# Patient Record
Sex: Male | Born: 1955 | Race: White | Hispanic: No | Smoking: Former smoker
Health system: Southern US, Community
[De-identification: ages and names within clinical notes are randomized; demographics above are authoritative.]

## PROBLEM LIST (undated history)

## (undated) DIAGNOSIS — I10 Essential (primary) hypertension: Secondary | ICD-10-CM

## (undated) DIAGNOSIS — I4892 Unspecified atrial flutter: Secondary | ICD-10-CM

## (undated) DIAGNOSIS — T7840XA Allergy, unspecified, initial encounter: Secondary | ICD-10-CM

## (undated) DIAGNOSIS — I2510 Atherosclerotic heart disease of native coronary artery without angina pectoris: Secondary | ICD-10-CM

## (undated) DIAGNOSIS — I5042 Chronic combined systolic (congestive) and diastolic (congestive) heart failure: Secondary | ICD-10-CM

## (undated) DIAGNOSIS — I255 Ischemic cardiomyopathy: Secondary | ICD-10-CM

## (undated) DIAGNOSIS — N281 Cyst of kidney, acquired: Secondary | ICD-10-CM

## (undated) DIAGNOSIS — F1910 Other psychoactive substance abuse, uncomplicated: Secondary | ICD-10-CM

## (undated) HISTORY — DX: Ischemic cardiomyopathy: I25.5

## (undated) HISTORY — DX: Cyst of kidney, acquired: N28.1

## (undated) HISTORY — DX: Other psychoactive substance abuse, uncomplicated: F19.10

## (undated) HISTORY — DX: Allergy, unspecified, initial encounter: T78.40XA

## (undated) HISTORY — PX: KNEE SURGERY: SHX244

## (undated) HISTORY — DX: Chronic combined systolic (congestive) and diastolic (congestive) heart failure: I50.42

## (undated) HISTORY — DX: Unspecified atrial flutter: I48.92

## (undated) HISTORY — PX: TONSILLECTOMY: SUR1361

## (undated) HISTORY — DX: Atherosclerotic heart disease of native coronary artery without angina pectoris: I25.10

---

## 2013-03-05 ENCOUNTER — Encounter (HOSPITAL_BASED_OUTPATIENT_CLINIC_OR_DEPARTMENT_OTHER): Payer: Self-pay | Admitting: *Deleted

## 2013-03-05 ENCOUNTER — Emergency Department (HOSPITAL_BASED_OUTPATIENT_CLINIC_OR_DEPARTMENT_OTHER)
Admission: EM | Admit: 2013-03-05 | Discharge: 2013-03-05 | Disposition: A | Discharge status: Home / Self Care | Payer: Self-pay | Attending: Emergency Medicine | Admitting: Emergency Medicine

## 2013-03-05 DIAGNOSIS — F17200 Nicotine dependence, unspecified, uncomplicated: Secondary | ICD-10-CM | POA: Insufficient documentation

## 2013-03-05 DIAGNOSIS — Z79899 Other long term (current) drug therapy: Secondary | ICD-10-CM | POA: Insufficient documentation

## 2013-03-05 DIAGNOSIS — I10 Essential (primary) hypertension: Secondary | ICD-10-CM | POA: Insufficient documentation

## 2013-03-05 HISTORY — DX: Essential (primary) hypertension: I10

## 2013-03-05 LAB — COMPREHENSIVE METABOLIC PANEL
ALT: 11 U/L (ref 0–53)
Albumin: 4.2 g/dL (ref 3.5–5.2)
Alkaline Phosphatase: 105 U/L (ref 39–117)
BUN: 14 mg/dL (ref 6–23)
Calcium: 9.7 mg/dL (ref 8.4–10.5)
GFR calc Af Amer: >90 mL/min (ref >90)
Glucose, Bld: 91 mg/dL (ref 70–99)
Potassium: 4.5 mEq/L (ref 3.5–5.1)
Sodium: 138 mEq/L (ref 135–145)
Total Protein: 8.3 g/dL (ref 6.0–8.3)

## 2013-03-05 LAB — CBC WITH DIFFERENTIAL/PLATELET
Basophils Relative: 1 % (ref 0–1)
Eosinophils Absolute: 0.2 10*3/uL (ref 0.0–0.7)
Eosinophils Relative: 2 % (ref 0–5)
Lymphs Abs: 2.1 10*3/uL (ref 0.7–4.0)
MCH: 29.6 pg (ref 26.0–34.0)
MCHC: 34.8 g/dL (ref 30.0–36.0)
MCV: 85.1 fL (ref 78.0–100.0)
Neutrophils Relative %: 56 % (ref 43–77)
Platelets: 189 10*3/uL (ref 150–400)
RBC: 5.84 MIL/uL — ABNORMAL HIGH (ref 4.22–5.81)

## 2013-03-05 LAB — TROPONIN I: Troponin I: <0.3 ng/mL (ref <0.30)

## 2013-03-05 MED ORDER — METOPROLOL TARTRATE 50 MG PO TABS
50.0000 mg | ORAL_TABLET | Freq: Once | ORAL | Status: AC
  Administered 2013-03-05: 50 mg via ORAL
  Filled 2013-03-05: qty 1

## 2013-03-05 MED ORDER — METOPROLOL TARTRATE 50 MG PO TABS: 50.0000 mg | ORAL_TABLET | Freq: Two times a day (BID) | ORAL | Status: DC

## 2013-03-05 NOTE — ED Notes (Signed)
Patient states he has chronic elevated blood pressure.  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  States he was at [facility] and yesterday he was admitted to [facility].  States this morning he was light headed and dizzy.  Asked to have his bp checked and it was 176 /120.  Repeated in one hour and his bp 172/110.

## 2013-03-05 NOTE — ED Provider Notes (Signed)
History     CSN: [id number]  Arrival date & time 03/05/13  1030   First MD Initiated Contact with Patient 03/05/13 1101      Chief Complaint  Patient presents with  . Hypertension    (Consider location/radiation/quality/duration/timing/severity/associated sxs/prior treatment) HPI Comments: Patient with history of htn.  Has been off of medications for the past 10 years.  Recently was admitted to [facility] for treatment of crack addiction then sent to [facility] yesterday.  His blood pressure was elevated there to 170/110 and was sent here for evaluation.  He tells me he feels a "little light-headed" but is otherwise without complaint.  No chest pain or shortness of breath.  Patient is a 57 y.o. male presenting with hypertension. The history is provided by the patient.  Hypertension This is a chronic problem. The problem occurs constantly. The problem has not changed since onset.Pertinent negatives include no chest pain and no shortness of breath. Nothing aggravates the symptoms. Nothing relieves the symptoms. He has tried nothing for the symptoms.    Past Medical History  Diagnosis Date  . Hypertension     Past Surgical History  Procedure Laterality Date  . Knee surgery      No family history on file.  History  Substance Use Topics  . Smoking status: Current Every Day Smoker -- 0.50 packs/day    Types: Cigarettes  . Smokeless tobacco: Never Used  . Alcohol Use: No      Review of Systems  Respiratory: Negative for shortness of breath.   Cardiovascular: Negative for chest pain.  All other systems reviewed and are negative.    Allergies  Review of patient's allergies indicates no known allergies.  Home Medications   Current Outpatient Rx  Name  Route  Sig  Dispense  Refill  . FLUoxetine (PROZAC) 20 MG capsule   Oral   Take 20 mg by mouth daily.         Marland Kitchen lisinopril (PRINIVIL,ZESTRIL) 10 MG tablet   Oral   Take 10 mg by mouth daily.           BP 160/111   Pulse 68  Temp(Src) 97.5 F (36.4 C) (Oral)  Resp 20  SpO2 100%  Physical Exam  Nursing note and vitals reviewed. Constitutional: He appears well-developed and well-nourished. No distress.  HENT:  Head: Normocephalic and atraumatic.  Mouth/Throat: Oropharynx is clear and moist.  Eyes: EOM are normal. Pupils are equal, round, and reactive to light.  Neck: Normal range of motion. Neck supple.  Cardiovascular: Normal rate and regular rhythm.   No murmur heard. Pulmonary/Chest: Effort normal and breath sounds normal. No respiratory distress. He has no wheezes.  Abdominal: Soft. Bowel sounds are normal. He exhibits no distension. There is no tenderness.  Musculoskeletal: Normal range of motion. He exhibits no edema.  Neurological: He is alert. No cranial nerve deficit. He exhibits normal muscle tone. Coordination normal.  Skin: Skin is warm and dry. He is not diaphoretic.    ED Course  Procedures (including critical care time)  Labs Reviewed  CBC WITH DIFFERENTIAL  COMPREHENSIVE METABOLIC PANEL   No results found.   No diagnosis found.   Date: 03/05/2013  Rate: 57  Rhythm: sinus bradycardia  QRS Axis: normal  Intervals: normal  ST/T Wave abnormalities: normal  Conduction Disutrbances:none  Narrative Interpretation:   Old EKG Reviewed: none available    MDM  The bp is improving with meds.  I suspect he has been hypertensive for quite some and  that this is not new.  Will start him on metoprolol until he can follow up with his pcp.        Geoffery Lyons, MD 03/05/13 1242

## 2013-04-20 ENCOUNTER — Encounter (HOSPITAL_BASED_OUTPATIENT_CLINIC_OR_DEPARTMENT_OTHER): Payer: Self-pay | Admitting: Family Medicine

## 2013-04-20 ENCOUNTER — Other Ambulatory Visit: Payer: Self-pay

## 2013-04-20 ENCOUNTER — Emergency Department (HOSPITAL_BASED_OUTPATIENT_CLINIC_OR_DEPARTMENT_OTHER): Payer: Self-pay

## 2013-04-20 ENCOUNTER — Emergency Department (HOSPITAL_BASED_OUTPATIENT_CLINIC_OR_DEPARTMENT_OTHER)
Admission: EM | Admit: 2013-04-20 | Discharge: 2013-04-20 | Disposition: A | Discharge status: Home / Self Care | Payer: Self-pay | Attending: Emergency Medicine | Admitting: Emergency Medicine

## 2013-04-20 DIAGNOSIS — I10 Essential (primary) hypertension: Secondary | ICD-10-CM | POA: Insufficient documentation

## 2013-04-20 DIAGNOSIS — F17200 Nicotine dependence, unspecified, uncomplicated: Secondary | ICD-10-CM | POA: Insufficient documentation

## 2013-04-20 DIAGNOSIS — Z79899 Other long term (current) drug therapy: Secondary | ICD-10-CM | POA: Insufficient documentation

## 2013-04-20 DIAGNOSIS — J3489 Other specified disorders of nose and nasal sinuses: Secondary | ICD-10-CM | POA: Insufficient documentation

## 2013-04-20 LAB — COMPREHENSIVE METABOLIC PANEL
Albumin: 3.9 g/dL (ref 3.5–5.2)
Alkaline Phosphatase: 107 U/L (ref 39–117)
BUN: 21 mg/dL (ref 6–23)
CO2: 26 mEq/L (ref 19–32)
Chloride: 104 mEq/L (ref 96–112)
Creatinine, Ser: 0.8 mg/dL (ref 0.50–1.35)
GFR calc Af Amer: >90 mL/min (ref >90)
GFR calc non Af Amer: >90 mL/min (ref >90)
Glucose, Bld: 102 mg/dL — ABNORMAL HIGH (ref 70–99)
Potassium: 4.6 mEq/L (ref 3.5–5.1)
Total Bilirubin: 0.3 mg/dL (ref 0.3–1.2)

## 2013-04-20 LAB — CBC WITH DIFFERENTIAL/PLATELET
HCT: 47.2 % (ref 39.0–52.0)
Hemoglobin: 16.2 g/dL (ref 13.0–17.0)
Lymphocytes Relative: 39 % (ref 12–46)
Lymphs Abs: 2.8 10*3/uL (ref 0.7–4.0)
Monocytes Absolute: 0.8 10*3/uL (ref 0.1–1.0)
Monocytes Relative: 12 % (ref 3–12)
Neutro Abs: 3.3 10*3/uL (ref 1.7–7.7)
Neutrophils Relative %: 46 % (ref 43–77)
RBC: 5.38 MIL/uL (ref 4.22–5.81)

## 2013-04-20 LAB — URINALYSIS, ROUTINE W REFLEX MICROSCOPIC
Glucose, UA: NEGATIVE mg/dL
Hgb urine dipstick: NEGATIVE
Leukocytes, UA: NEGATIVE
Specific Gravity, Urine: 1.036 — ABNORMAL HIGH (ref 1.005–1.030)
Urobilinogen, UA: 1 mg/dL (ref 0.0–1.0)

## 2013-04-20 NOTE — ED Provider Notes (Signed)
History    This chart was scribed for Alex Octave, MD by Leone Payor, ED Scribe. This patient was seen in room MH10/MH10 and the patient's care was started 4:12 PM.   CSN: [id number]  Arrival date & time 04/20/13  1300   First MD Initiated Contact with Patient 04/20/13 1605      Chief Complaint  Patient presents with  . Headache  . Nasal Congestion     The history is provided by the patient. No language interpreter was used.    Alex Winters is a 57 y.o. male who presents to the Emergency Department complaining of HTN starting today. Pt is at [facility] rehab for cocaine addiction. Pt takes lisinopril and lopressor starting about 1 months ago. Pt states he woke up with a gradual onset HA upon waking up that he rated as 8/10. Pt states it worsened but then he went back to sleep. He took ibuprofen later today with some relief. He denies having the HA currently. He does not get HAs frequently and denies blurry vision. His HA was on both sides of head, back and over sinuses. He denies chest pain, SOB, abdominal pain,    Past Medical History  Diagnosis Date  . Hypertension     Past Surgical History  Procedure Laterality Date  . Knee surgery      No family history on file.  History  Substance Use Topics  . Smoking status: Current Every Day Smoker -- 0.50 packs/day    Types: Cigarettes  . Smokeless tobacco: Never Used  . Alcohol Use: No      Review of Systems A complete 10 system review of systems was obtained and all systems are negative except as noted in the HPI and PMH.   Allergies  Review of patient's allergies indicates no known allergies.  Home Medications   Current Outpatient Rx  Name  Route  Sig  Dispense  Refill  . FLUoxetine (PROZAC) 20 MG capsule   Oral   Take 20 mg by mouth daily.         Marland Kitchen lisinopril (PRINIVIL,ZESTRIL) 10 MG tablet   Oral   Take 10 mg by mouth daily.         . metoprolol (LOPRESSOR) 50 MG tablet   Oral   Take 1 tablet (50  mg total) by mouth 2 (two) times daily.   60 tablet   1     BP 140/81  Pulse 54  Temp(Src) 98.7 F (37.1 C) (Oral)  Resp 16  Ht 6\' 2"  (1.88 m)  Wt 200 lb (90.719 kg)  BMI 25.67 kg/m2  SpO2 95%  Physical Exam  Nursing note and vitals reviewed. Constitutional: He is oriented to person, place, and time. He appears well-developed and well-nourished. No distress.  HENT:  Head: Normocephalic and atraumatic.  No temporal artery tenderness.  Eyes: EOM are normal.  Neck: Neck supple. No tracheal deviation present.  Cardiovascular: Normal rate, regular rhythm and normal heart sounds.   Pulmonary/Chest: Effort normal and breath sounds normal. No respiratory distress. He has no wheezes. He has no rales. He exhibits no tenderness.  Abdominal: Soft. He exhibits no distension. There is no tenderness. There is no rebound.  Musculoskeletal: Normal range of motion.  Neurological: He is alert and oriented to person, place, and time.  5/5 strength throughout, cranial nerves intact. Normal finger to nose.  Skin: Skin is warm and dry.  Psychiatric: He has a normal mood and affect. His behavior is normal.  ED Course  Procedures (including critical care time)  DIAGNOSTIC STUDIES: Oxygen Saturation is 95% on room air, adequate by my interpretation.    COORDINATION OF CARE: 4:24 PM-Discussed treatment plan with pt at bedside and pt agreed to plan.    Labs Reviewed  COMPREHENSIVE METABOLIC PANEL - Abnormal; Notable for the following:    Glucose, Bld 102 (*)    All other components within normal limits  URINALYSIS, ROUTINE W REFLEX MICROSCOPIC - Abnormal; Notable for the following:    Specific Gravity, Urine 1.036 (*)    Ketones, ur 15 (*)    All other components within normal limits  CBC WITH DIFFERENTIAL  TROPONIN I   Ct Head Wo Contrast  04/20/2013  *RADIOLOGY REPORT*  Clinical Data: Patient c/o HTN, dizziness, states that he has had an occipital region headache as well, headache  eases off when he presses on his temples, denies any known hx of stroke or injury, no other complaints  CT HEAD WITHOUT CONTRAST  Technique:  Contiguous axial images were obtained from the base of the skull through the vertex without contrast.  Comparison: None.  Findings: The brain stem, cerebellum, cerebral peduncles, thalami, basal ganglia, basilar cisterns, and ventricular system appear unremarkable.  No intracranial hemorrhage, mass lesion, or acute infarction is identified.  The visualized paranasal sinuses appear clear.  IMPRESSION:  1.  No significant abnormality identified.   Original Report Authenticated By: Gaylyn Rong, M.D.      No diagnosis found.       MDM  Patient from day [facility] with elevated blood pressure and gradual onset headache this morning. Denies thunderclap onset. Denies chest pain or shortness of breath. States compliance with lisinopril and metoprolol.  Labs unremarkable. No evidence of endorgan damage. Blood pressure well controlled in ED. Bradycardic on EKG without symptoms. No neuro deficits. Due to bradycardia will decrease Lopressor dose to 25 mg BID. Followup with PCP. Resource guide given.   Date: 04/20/2013  Rate: 45  Rhythm: sinus bradycardia  QRS Axis: normal  Intervals: normal  ST/T Wave abnormalities: normal  Conduction Disutrbances:none  Narrative Interpretation:   Old EKG Reviewed: unchanged      I personally performed the services described in this documentation, which was scribed in my presence. The recorded information has been reviewed and is accurate.      Alex Octave, MD 04/20/13 [phone]

## 2013-04-20 NOTE — ED Notes (Signed)
Pt c/o hypertension. He is at [facility] rehab and does not have a PCP yet. Pt takes bp med. Also c/o headache.

## 2013-06-30 DIAGNOSIS — I10 Essential (primary) hypertension: Secondary | ICD-10-CM | POA: Insufficient documentation

## 2013-07-10 IMAGING — CT CT HEAD W/O CM
1 series · 16 of 30 positions shown, 20 images · non-contrast
Comparison: None.

CLINICAL DATA: Patient c/o HTN, dizziness, states that he has had
an occipital region headache as well, headache eases off when he
presses on his temples, denies any known hx of stroke or injury, no
other complaints

CT HEAD WITHOUT CONTRAST
TECHNIQUE: Contiguous axial images were obtained from the base of
the skull through the vertex without contrast.

[Series 2: head 4.8 h37s · axial · 0.47mm/px · z∈[-126,+30]mm · 16 of 36 slices shown, 20 images]
[im 2/36  brain]
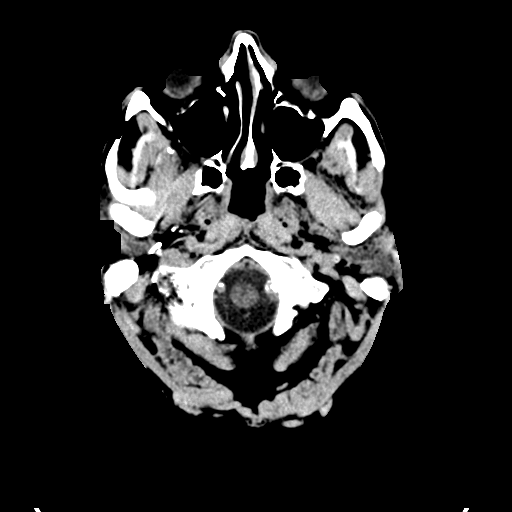
[im 2/36  bone]
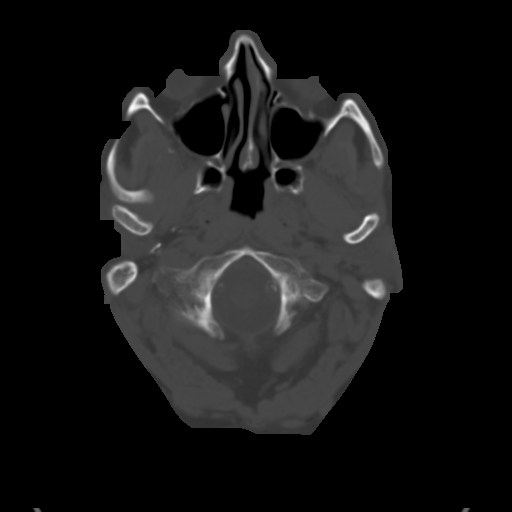
[im 4/36  brain]
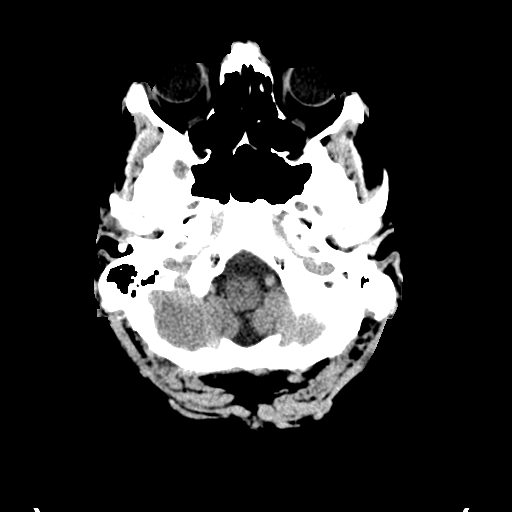
[im 7/36  brain]
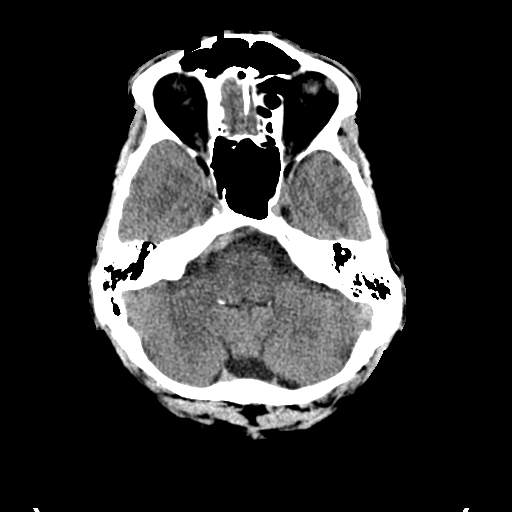
[im 9/36  brain]
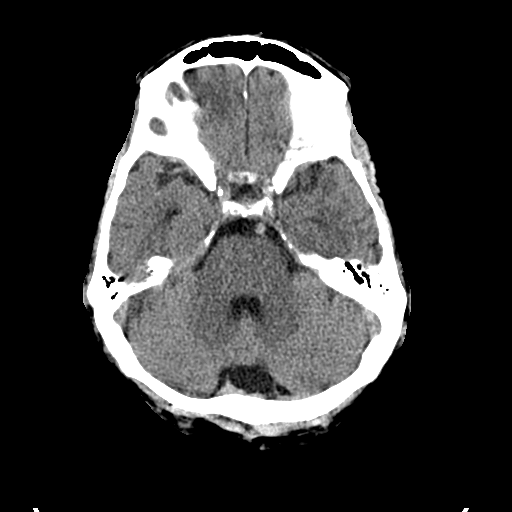
[im 10/36  brain]
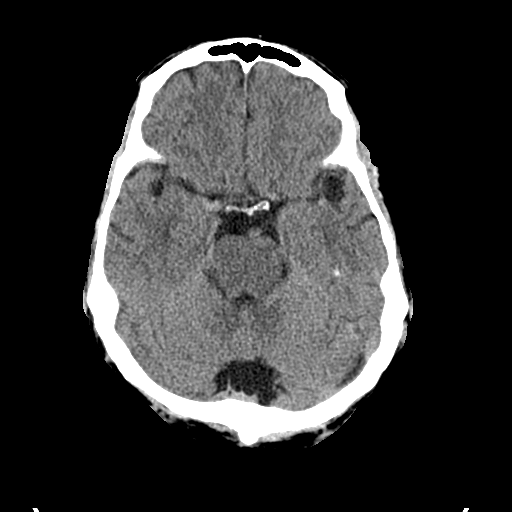
[im 10/36  bone]
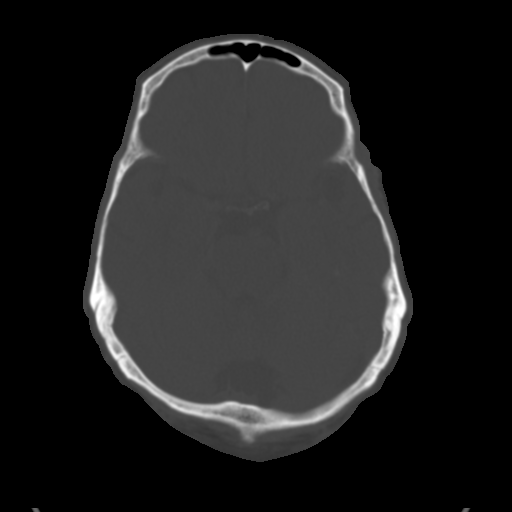
[im 13/36  brain]
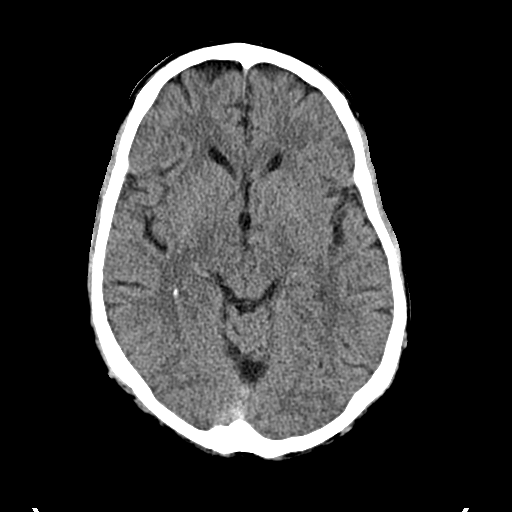
[im 15/36  brain]
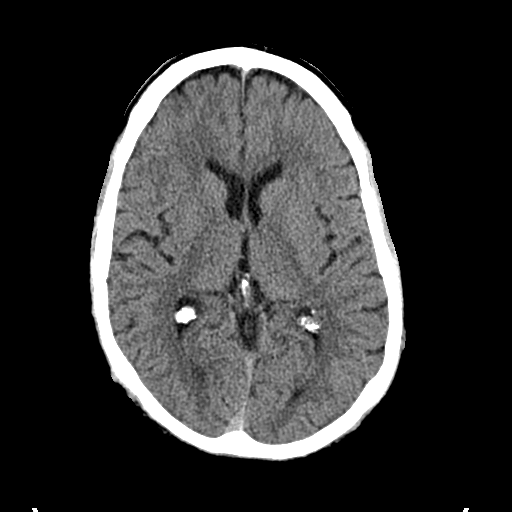
[im 17/36  brain]
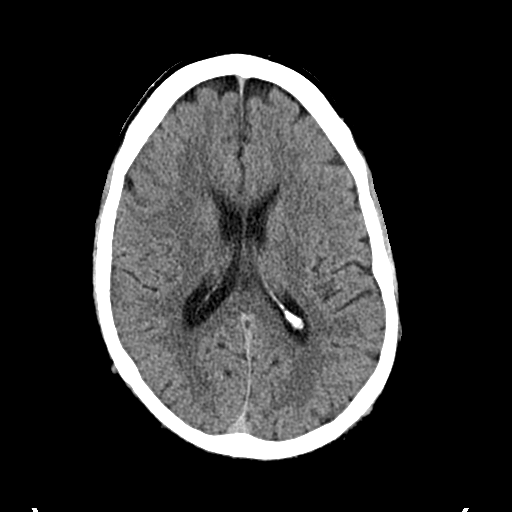
[im 19/36  brain]
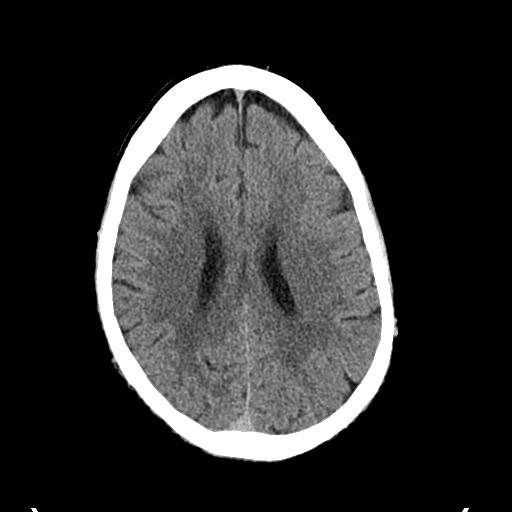
[im 19/36  bone]
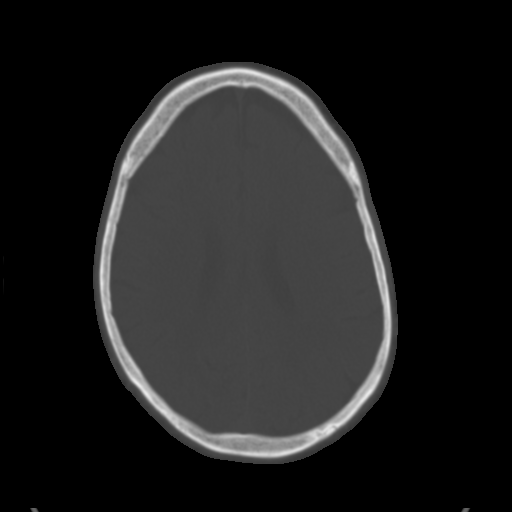
[im 21/36  brain]
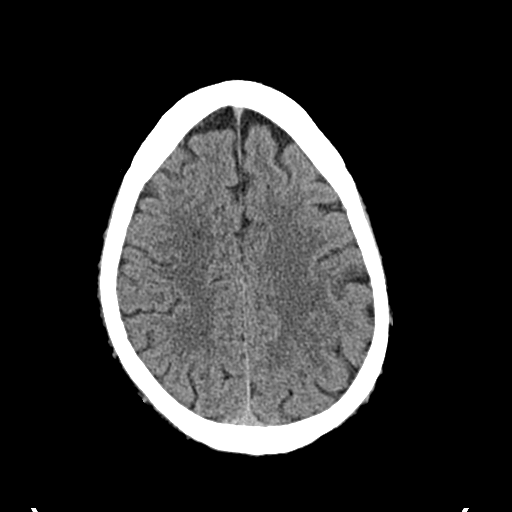
[im 23/36  brain]
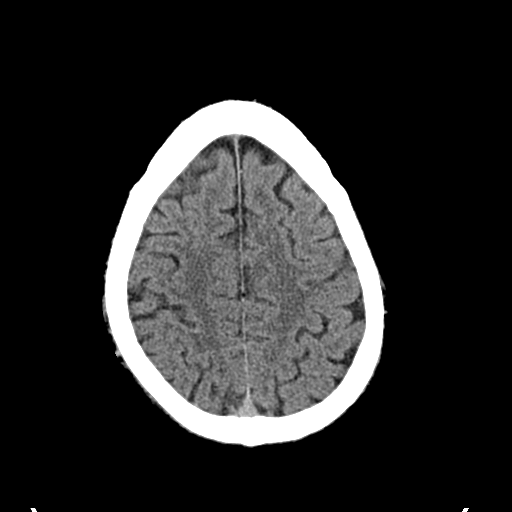
[im 26/36  brain]
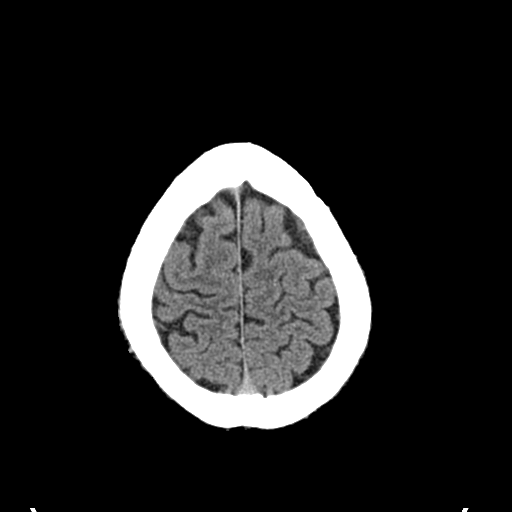
[im 27/36  brain]
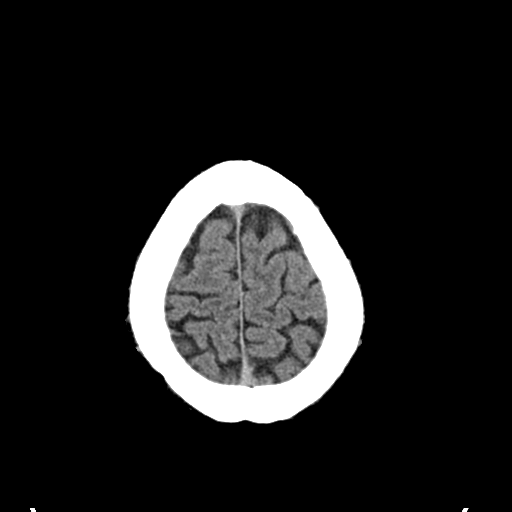
[im 27/36  bone]
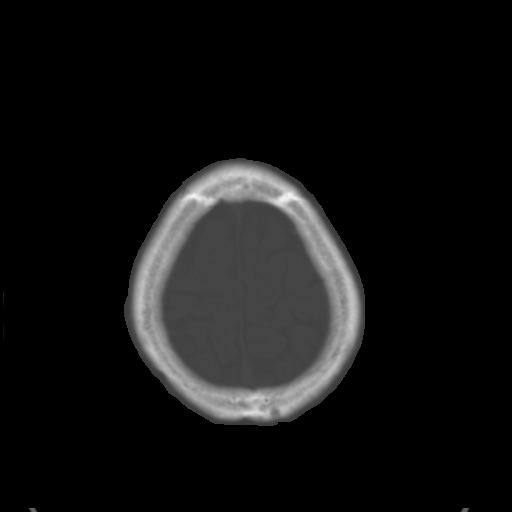
[im 29/36  brain]
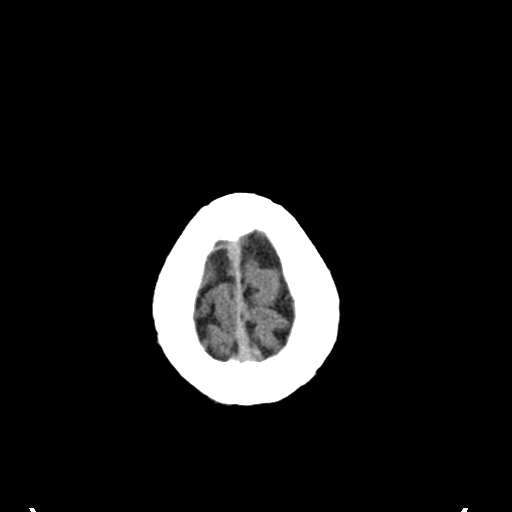
[im 32/36  brain]
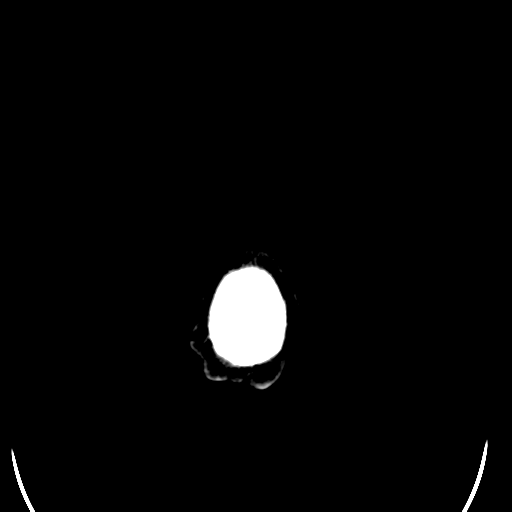
[im 34/36  brain]
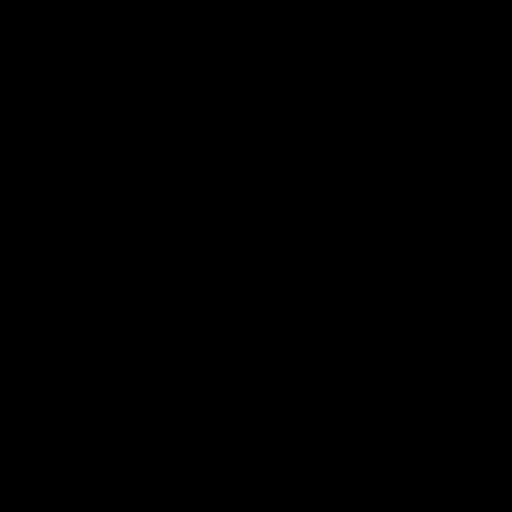

[16 of 30 positions shown; findings below may reference images not displayed]

FINDINGS: The brain stem, cerebellum, cerebral peduncles, thalami,
basal ganglia, basilar cisterns, and ventricular system appear
unremarkable.

No intracranial hemorrhage, mass lesion, or acute infarction is
identified.

The visualized paranasal sinuses appear clear.
IMPRESSION: 1.  No significant abnormality identified.

## 2013-11-25 LAB — POCT ERYTHROCYTE SEDIMENTATION RATE, NON-AUTOMATED: SED RATE: 3 mm

## 2013-12-09 ENCOUNTER — Emergency Department: Payer: Self-pay | Admitting: Internal Medicine

## 2013-12-18 ENCOUNTER — Ambulatory Visit: Payer: Self-pay | Admitting: Internal Medicine

## 2014-01-15 ENCOUNTER — Ambulatory Visit: Payer: Self-pay | Admitting: Internal Medicine

## 2014-02-02 LAB — CBC AND DIFFERENTIAL
HCT: 43 % (ref 41–53)
Hemoglobin: 14.7 g/dL (ref 13.5–17.5)
Neutrophils Absolute: 4 /uL
Platelets: 196 10*3/uL (ref 150–399)
WBC: 8.3 10^3/mL

## 2014-03-16 ENCOUNTER — Emergency Department: Payer: Self-pay | Admitting: Emergency Medicine

## 2014-03-16 LAB — COMPREHENSIVE METABOLIC PANEL
ALBUMIN: 3.7 g/dL (ref 3.4–5.0)
ALK PHOS: 106 U/L
Anion Gap: 4 — ABNORMAL LOW (ref 7–16)
BUN: 17 mg/dL (ref 7–18)
Bilirubin,Total: 0.4 mg/dL (ref 0.2–1.0)
CHLORIDE: 100 mmol/L (ref 98–107)
CREATININE: 1.03 mg/dL (ref 0.60–1.30)
Calcium, Total: 8.8 mg/dL (ref 8.5–10.1)
Co2: 26 mmol/L (ref 21–32)
EGFR (African American): >60
EGFR (Non-African Amer.): >60
GLUCOSE: 102 mg/dL — AB (ref 65–99)
Osmolality: 263 (ref 275–301)
Potassium: 4.2 mmol/L (ref 3.5–5.1)
SGOT(AST): 20 U/L (ref 15–37)
SGPT (ALT): 15 U/L (ref 12–78)
SODIUM: 130 mmol/L — AB (ref 136–145)
TOTAL PROTEIN: 7.7 g/dL (ref 6.4–8.2)

## 2014-03-16 LAB — CBC
HCT: 47.3 % (ref 40.0–52.0)
HGB: 16.1 g/dL (ref 13.0–18.0)
MCH: 30 pg (ref 26.0–34.0)
MCHC: 34 g/dL (ref 32.0–36.0)
MCV: 88 fL (ref 80–100)
PLATELETS: 127 10*3/uL — AB (ref 150–440)
RBC: 5.36 10*6/uL (ref 4.40–5.90)
RDW: 13.8 % (ref 11.5–14.5)
WBC: 5 10*3/uL (ref 3.8–10.6)

## 2014-03-16 LAB — LIPASE, BLOOD: Lipase: 216 U/L (ref 73–393)

## 2014-06-21 ENCOUNTER — Other Ambulatory Visit: Payer: Self-pay | Admitting: *Deleted

## 2014-12-21 LAB — HEPATIC FUNCTION PANEL
ALT: 6 U/L — AB (ref 10–40)
AST: 13 U/L — AB (ref 14–40)
Alkaline Phosphatase: 88 U/L (ref 25–125)
BILIRUBIN DIRECT: 0.08 mg/dL (ref 0.01–0.4)
Bilirubin, Total: 0.3 mg/dL

## 2014-12-21 LAB — LIPID PANEL
Cholesterol: 240 mg/dL — AB (ref 0–200)
HDL: 30 mg/dL — AB (ref 35–70)
LDL CALC: 144 mg/dL
Triglycerides: 331 mg/dL — AB (ref 40–160)

## 2014-12-21 LAB — BASIC METABOLIC PANEL
BUN: 14 mg/dL (ref 4–21)
Creatinine: 0.8 mg/dL (ref 0.6–1.3)
GLUCOSE: 91 mg/dL
POTASSIUM: 5.7 mmol/L — AB (ref 3.4–5.3)
SODIUM: 137 mmol/L (ref 137–147)

## 2014-12-21 LAB — TSH: TSH: 2.27 u[IU]/mL (ref 0.41–5.90)

## 2014-12-28 DIAGNOSIS — E785 Hyperlipidemia, unspecified: Secondary | ICD-10-CM | POA: Insufficient documentation

## 2016-09-10 DIAGNOSIS — E785 Hyperlipidemia, unspecified: Secondary | ICD-10-CM

## 2016-09-10 DIAGNOSIS — I10 Essential (primary) hypertension: Secondary | ICD-10-CM

## 2016-09-11 ENCOUNTER — Emergency Department
Admission: EM | Admit: 2016-09-11 | Discharge: 2016-09-11 | Disposition: A | Discharge status: Home / Self Care | Payer: Self-pay | Attending: Emergency Medicine | Admitting: Emergency Medicine

## 2016-09-11 ENCOUNTER — Encounter: Payer: Self-pay | Admitting: Emergency Medicine

## 2016-09-11 DIAGNOSIS — K029 Dental caries, unspecified: Secondary | ICD-10-CM | POA: Insufficient documentation

## 2016-09-11 DIAGNOSIS — K047 Periapical abscess without sinus: Secondary | ICD-10-CM | POA: Insufficient documentation

## 2016-09-11 DIAGNOSIS — F17210 Nicotine dependence, cigarettes, uncomplicated: Secondary | ICD-10-CM | POA: Insufficient documentation

## 2016-09-11 DIAGNOSIS — I10 Essential (primary) hypertension: Secondary | ICD-10-CM | POA: Insufficient documentation

## 2016-09-11 DIAGNOSIS — Z79899 Other long term (current) drug therapy: Secondary | ICD-10-CM | POA: Insufficient documentation

## 2016-09-11 MED ORDER — LIDOCAINE VISCOUS 2 % MT SOLN
15.0000 mL | Freq: Once | OROMUCOSAL | Status: AC
  Administered 2016-09-11: 15 mL via OROMUCOSAL
  Filled 2016-09-11: qty 15

## 2016-09-11 MED ORDER — OXYCODONE-ACETAMINOPHEN 5-325 MG PO TABS
1.0000 | ORAL_TABLET | Freq: Once | ORAL | Status: AC
  Administered 2016-09-11: 1 via ORAL
  Filled 2016-09-11: qty 1

## 2016-09-11 MED ORDER — AMOXICILLIN 500 MG PO CAPS
500.0000 mg | ORAL_CAPSULE | Freq: Once | ORAL | Status: AC
  Administered 2016-09-11: 500 mg via ORAL
  Filled 2016-09-11: qty 1

## 2016-09-11 MED ORDER — CLINDAMYCIN HCL 300 MG PO CAPS: 300.0000 mg | ORAL_CAPSULE | Freq: Three times a day (TID) | ORAL | 0 refills | Status: AC

## 2016-09-11 MED ORDER — AMLODIPINE BESYLATE 5 MG PO TABS: 5.0000 mg | ORAL_TABLET | Freq: Every day | ORAL | 3 refills | Status: DC

## 2016-09-11 MED ORDER — LISINOPRIL 10 MG PO TABS: 40.0000 mg | ORAL_TABLET | Freq: Every day | ORAL | 3 refills | Status: DC

## 2016-09-11 MED ORDER — OXYCODONE-ACETAMINOPHEN 5-325 MG PO TABS: 1.0000 | ORAL_TABLET | ORAL | 0 refills | Status: DC | PRN

## 2016-09-11 MED ORDER — AMOXICILLIN 875 MG PO TABS: 875.0000 mg | ORAL_TABLET | Freq: Two times a day (BID) | ORAL | 0 refills | Status: AC

## 2016-09-11 NOTE — ED Notes (Signed)
Discharge instructions reviewed with patient. Questions fielded by this RN. Patient verbalizes understanding of instructions. Patient discharged home in stable condition per Manson PasseyBrown MD . No acute distress noted at time of discharge. Pt given a hand out in regards to medication assistance.

## 2016-09-11 NOTE — ED Provider Notes (Signed)
[facility] Emergency Department Provider Note   First MD Initiated Contact with Patient 09/11/16 [phone]     (approximate)  I have reviewed the triage vital signs and the nursing notes.   HISTORY  Chief Complaint Dental Pain   HPI Alex Winters is a 60 y.o. male presents with left maxillary premolar pain diffuse dental cavities.. Patient denies any fever no difficulty swallowing. Patient admits to left jaw swelling.   Past Medical History:  Diagnosis Date  . Allergy    Nuts  . Hypertension     Patient Active Problem List   Diagnosis Date Noted  . Hyperlipidemia 12/28/2014  . Hypertension 06/30/2013    Past Surgical History:  Procedure Laterality Date  . KNEE SURGERY    . TONSILLECTOMY      Prior to Admission medications   Medication Sig Start Date End Date Taking? Authorizing Provider  FLUoxetine (PROZAC) 20 MG capsule Take 20 mg by mouth daily.    Historical Provider, MD  lisinopril (PRINIVIL,ZESTRIL) 10 MG tablet Take 10 mg by mouth daily.    Historical Provider, MD  metoprolol (LOPRESSOR) 50 MG tablet Take 1 tablet (50 mg total) by mouth 2 (two) times daily. 03/05/13   Geoffery Lyonsouglas Delo, MD    Allergies Peanut-containing drug products  Family History  Problem Relation Age of Onset  . Heart disease Mother   . Diabetes Mother   . Hypertension Father     Social History Social History  Substance Use Topics  . Smoking status: Current Every Day Smoker    Packs/day: 0.50    Types: Cigarettes  . Smokeless tobacco: Never Used  . Alcohol use No    Review of Systems Constitutional: No fever/chills Eyes: No visual changes. ENT: No sore throat.Positive for dental caries and left jaw swelling Cardiovascular: Denies chest pain. Respiratory: Denies shortness of breath. Gastrointestinal: No abdominal pain.  No nausea, no vomiting.  No diarrhea.  No constipation. Genitourinary: Negative for dysuria. Musculoskeletal: Negative for back  pain. Skin: Negative for rash. Neurological: Negative for headaches, focal weakness or numbness.  10-point ROS otherwise negative.  ____________________________________________   PHYSICAL EXAM:  VITAL SIGNS: ED Triage Vitals [09/11/16 0122]  Enc Vitals Group     BP (!) 192/107     Pulse Rate 74     Resp 18     Temp 97.7 F (36.5 C)     Temp Source Oral     SpO2 99 %     Weight 190 lb (86.2 kg)     Height 6\' 1"  (1.854 m)     Head Circumference      Peak Flow      Pain Score 10     Pain Loc      Pain Edu?      Excl. in GC?     Constitutional: Alert and oriented. Well appearing and in no acute distress. Eyes: Conjunctivae are normal. PERRL. EOMI. Head: Atraumatic. Mouth/Throat: Mucous membranes are moist.  Oropharynx non-erythematous.Diffuse dental caries Neck: No stridor.  No meningeal signs.   Cardiovascular: Normal rate, regular rhythm. Good peripheral circulation. Grossly normal heart sounds. Respiratory: Normal respiratory effort.  No retractions. Lungs CTAB. Gastrointestinal: Soft and nontender. No distention.  Musculoskeletal: No lower extremity tenderness nor edema. No gross deformities of extremities. Neurologic:  Normal speech and language. No gross focal neurologic deficits are appreciated.  Skin:  Skin is warm, dry and intact. No rash noted.     Procedures     INITIAL  IMPRESSION / ASSESSMENT AND PLAN / ED COURSE  Pertinent labs & imaging results that were available during my care of the patient were reviewed by me and considered in my medical decision making (see chart for details).     Clinical Course    ____________________________________________  FINAL CLINICAL IMPRESSION(S) / ED DIAGNOSES  Final diagnoses:  Dental caries  Dental abscess     MEDICATIONS GIVEN DURING THIS VISIT:  Medications  lidocaine (XYLOCAINE) 2 % viscous mouth solution 15 mL (15 mLs Mouth/Throat Given 09/11/16 0257)  oxyCODONE-acetaminophen (PERCOCET/ROXICET)  5-325 MG per tablet 1 tablet (1 tablet Oral Given 09/11/16 0257)  amoxicillin (AMOXIL) capsule 500 mg (500 mg Oral Given 09/11/16 0257)     NEW OUTPATIENT MEDICATIONS STARTED DURING THIS VISIT:  New Prescriptions   No medications on file    Modified Medications   No medications on file    Discontinued Medications   No medications on file     Note:  This document was prepared using Dragon voice recognition software and may include unintentional dictation errors.    Darci Current, MD 09/11/16 [phone]

## 2016-09-11 NOTE — ED Triage Notes (Addendum)
Pt ambulatory to triage with steady gait with c/o left sided toothache and swelling since Saturday. Pt reports loose tooth on upper left jaw. Pt reports hx of HTN but states has not been able to afford medicine so has not taken them for months.

## 2017-02-06 ENCOUNTER — Encounter: Payer: Self-pay | Admitting: *Deleted

## 2017-02-06 ENCOUNTER — Inpatient Hospital Stay (HOSPITAL_COMMUNITY)
Admit: 2017-02-06 | Discharge: 2017-02-06 | Disposition: A | Discharge status: Home / Self Care | Payer: Self-pay | Attending: Internal Medicine | Admitting: Internal Medicine

## 2017-02-06 ENCOUNTER — Inpatient Hospital Stay
Admission: EM | Admit: 2017-02-06 | Discharge: 2017-02-07 | DRG: 310 | Disposition: A | Discharge status: Home / Self Care | Payer: Self-pay | Attending: Internal Medicine | Admitting: Internal Medicine

## 2017-02-06 ENCOUNTER — Emergency Department: Payer: Self-pay

## 2017-02-06 DIAGNOSIS — I4892 Unspecified atrial flutter: Secondary | ICD-10-CM | POA: Diagnosis present

## 2017-02-06 DIAGNOSIS — Z91010 Allergy to peanuts: Secondary | ICD-10-CM

## 2017-02-06 DIAGNOSIS — Z833 Family history of diabetes mellitus: Secondary | ICD-10-CM

## 2017-02-06 DIAGNOSIS — Z8249 Family history of ischemic heart disease and other diseases of the circulatory system: Secondary | ICD-10-CM

## 2017-02-06 DIAGNOSIS — I4589 Other specified conduction disorders: Secondary | ICD-10-CM | POA: Diagnosis present

## 2017-02-06 DIAGNOSIS — E785 Hyperlipidemia, unspecified: Secondary | ICD-10-CM | POA: Diagnosis present

## 2017-02-06 DIAGNOSIS — I10 Essential (primary) hypertension: Secondary | ICD-10-CM | POA: Diagnosis present

## 2017-02-06 DIAGNOSIS — I483 Typical atrial flutter: Principal | ICD-10-CM | POA: Diagnosis present

## 2017-02-06 DIAGNOSIS — F17210 Nicotine dependence, cigarettes, uncomplicated: Secondary | ICD-10-CM | POA: Diagnosis present

## 2017-02-06 DIAGNOSIS — Z720 Tobacco use: Secondary | ICD-10-CM

## 2017-02-06 LAB — COMPREHENSIVE METABOLIC PANEL
ALK PHOS: 83 U/L (ref 38–126)
ALT: 11 U/L — ABNORMAL LOW (ref 17–63)
ANION GAP: 10 (ref 5–15)
AST: 22 U/L (ref 15–41)
Albumin: 3.8 g/dL (ref 3.5–5.0)
BILIRUBIN TOTAL: 0.4 mg/dL (ref 0.3–1.2)
BUN: 10 mg/dL (ref 6–20)
CALCIUM: 8.7 mg/dL — AB (ref 8.9–10.3)
CO2: 22 mmol/L (ref 22–32)
Chloride: 107 mmol/L (ref 101–111)
Creatinine, Ser: 1.06 mg/dL (ref 0.61–1.24)
Glucose, Bld: 99 mg/dL (ref 65–99)
Potassium: 4.2 mmol/L (ref 3.5–5.1)
Sodium: 139 mmol/L (ref 135–145)
TOTAL PROTEIN: 6.9 g/dL (ref 6.5–8.1)

## 2017-02-06 LAB — APTT: aPTT: 25 seconds (ref 24–36)

## 2017-02-06 LAB — CBC WITH DIFFERENTIAL/PLATELET
Basophils Absolute: 0.1 10*3/uL (ref 0–0.1)
Basophils Relative: 1 %
Eosinophils Absolute: 0.1 10*3/uL (ref 0–0.7)
Eosinophils Relative: 1 %
HEMATOCRIT: 51.6 % (ref 40.0–52.0)
HEMOGLOBIN: 17.3 g/dL (ref 13.0–18.0)
LYMPHS ABS: 2.1 10*3/uL (ref 1.0–3.6)
Lymphocytes Relative: 25 %
MCH: 29.7 pg (ref 26.0–34.0)
MCHC: 33.6 g/dL (ref 32.0–36.0)
MCV: 88.4 fL (ref 80.0–100.0)
MONO ABS: 0.6 10*3/uL (ref 0.2–1.0)
MONOS PCT: 8 %
NEUTROS PCT: 65 %
Neutro Abs: 5.5 10*3/uL (ref 1.4–6.5)
Platelets: 192 10*3/uL (ref 150–440)
RBC: 5.84 MIL/uL (ref 4.40–5.90)
RDW: 13.9 % (ref 11.5–14.5)
WBC: 8.5 10*3/uL (ref 3.8–10.6)

## 2017-02-06 LAB — PROTIME-INR
INR: 0.92
Prothrombin Time: 12.3 seconds (ref 11.4–15.2)

## 2017-02-06 LAB — TSH: TSH: 2.379 u[IU]/mL (ref 0.350–4.500)

## 2017-02-06 LAB — TROPONIN I: TROPONIN I: 0.03 ng/mL — AB (ref <0.03)

## 2017-02-06 LAB — MAGNESIUM: MAGNESIUM: 2 mg/dL (ref 1.7–2.4)

## 2017-02-06 MED ORDER — DILTIAZEM LOAD VIA INFUSION
15.0000 mg | Freq: Once | INTRAVENOUS | Status: AC
  Administered 2017-02-06: 15 mg via INTRAVENOUS
  Filled 2017-02-06: qty 15

## 2017-02-06 MED ORDER — METOPROLOL TARTRATE 25 MG PO TABS
25.0000 mg | ORAL_TABLET | Freq: Two times a day (BID) | ORAL | Status: DC
  Administered 2017-02-06 – 2017-02-07 (×3): 25 mg via ORAL
  Filled 2017-02-06 (×4): qty 1

## 2017-02-06 MED ORDER — SODIUM CHLORIDE 0.9 % IV BOLUS (SEPSIS)
1000.0000 mL | Freq: Once | INTRAVENOUS | Status: AC
  Administered 2017-02-06: 1000 mL via INTRAVENOUS

## 2017-02-06 MED ORDER — APIXABAN 5 MG PO TABS
5.0000 mg | ORAL_TABLET | Freq: Two times a day (BID) | ORAL | Status: DC
  Administered 2017-02-06 – 2017-02-07 (×3): 5 mg via ORAL
  Filled 2017-02-06 (×3): qty 1

## 2017-02-06 MED ORDER — ONDANSETRON HCL 4 MG/2ML IJ SOLN: 4.0000 mg | Freq: Four times a day (QID) | INTRAMUSCULAR | Status: DC | PRN

## 2017-02-06 MED ORDER — ACETAMINOPHEN 650 MG RE SUPP: 650.0000 mg | Freq: Four times a day (QID) | RECTAL | Status: DC | PRN

## 2017-02-06 MED ORDER — HEPARIN BOLUS VIA INFUSION
4000.0000 [IU] | Freq: Once | INTRAVENOUS | Status: DC
  Filled 2017-02-06: qty 4000

## 2017-02-06 MED ORDER — ONDANSETRON HCL 4 MG PO TABS: 4.0000 mg | ORAL_TABLET | Freq: Four times a day (QID) | ORAL | Status: DC | PRN

## 2017-02-06 MED ORDER — HEPARIN (PORCINE) IN NACL 100-0.45 UNIT/ML-% IJ SOLN
1200.0000 [IU]/h | INTRAMUSCULAR | Status: DC
  Filled 2017-02-06: qty 250

## 2017-02-06 MED ORDER — METOPROLOL TARTRATE 50 MG PO TABS: 50.0000 mg | ORAL_TABLET | Freq: Two times a day (BID) | ORAL | Status: DC

## 2017-02-06 MED ORDER — DILTIAZEM HCL 100 MG IV SOLR
5.0000 mg/h | INTRAVENOUS | Status: DC
  Administered 2017-02-06: 5 mg/h via INTRAVENOUS
  Administered 2017-02-06: 10 mg/h via INTRAVENOUS
  Administered 2017-02-06: 15 mg/h via INTRAVENOUS
  Filled 2017-02-06: qty 100

## 2017-02-06 MED ORDER — ACETAMINOPHEN 325 MG PO TABS: 650.0000 mg | ORAL_TABLET | Freq: Four times a day (QID) | ORAL | Status: DC | PRN

## 2017-02-06 NOTE — Consult Note (Signed)
Cardiology Consultation Note  Patient ID: Alex Winters, MRN: [id number], DOB/AGE: [birth year] 61 y.o. Admit date: 02/06/2017   Date of Consult: 02/06/2017 Primary Physician: No PCP Per Patient Primary Cardiologist: [facility] - consult by End Requesting Physician: Dr. Luberta MutterKonidena, MD  Chief Complaint: Palpitations/SOB Reason for Consult: New onset atrial flutter with RVR  HPI: 61 y.o. male with h/o HTN and ongoing tobacco abuse presented to [facility] on [facility]/7 with a one day history of palpitations and elevated blood pressure.   He does not have any previously known cardiac history. Patient was previously on metoprolol, lisinopril and amlodipine for BP control. He was seen recently in the ED for dental pain and noted to be hypertensive. He was given prescriptions for lisinopril and amlodipine and told to follow up with PCP. He did not. Patient has not taken his medications since then. On the morning of 2/6 he noted palpitations and increased SOB when he woke up. He thought it was related to his BP so he restarted his medications. He went to work on 2/6 all day with continued palpitations. Slept well all night into 2/7. When he got up this morning he still noted palpitations. So he took his BP and found it to be in the 140's over 1-teens. Heart rate in the 150's bpm. He decided to come to the ED.    Upon the [facility]'s [facility] they were found to be in new onset typical atrial flutter with 2:1 AV conduction with a heart rate in the 150's bpm. BP stable in the 130-140 systolic range. He was given IV diltiazem without improvement in the heart rate. Cardiology was called who advised starting a diltiazem gtt. This led the patient to convert to a junctional rhythm at 12:14 PM followed by a short run of atrial tachycardia followed by conversion to sinus rhythm with a heart rate in the 60's bpm at 12:15 PM and has remained in sinus rhythm since. Labs showed a troponin of 0.03, magnesium 2.0, TSH pending, K+ 4.2,  SCr 1.06, unremarkable CBC. ECG as below, CXR showed no acute process. He was started on Eliquis by IM. He is feeling much better now and wants to go home.    Past Medical History:  Diagnosis Date  . Allergy    Nuts  . Hypertension       Most Recent Cardiac Studies: none   Surgical History:  Past Surgical History:  Procedure Laterality Date  . KNEE SURGERY    . TONSILLECTOMY       Home Meds: Prior to Admission medications   Medication Sig Start Date End Date Taking? Authorizing Provider  amLODipine (NORVASC) 5 MG tablet Take 1 tablet (5 mg total) by mouth daily. 09/11/16  Yes Darci Currentandolph N Brown, MD  lisinopril (PRINIVIL,ZESTRIL) 10 MG tablet Take 4 tablets (40 mg total) by mouth daily. 09/11/16  Yes Darci Currentandolph N Brown, MD  metoprolol (LOPRESSOR) 50 MG tablet Take 1 tablet (50 mg total) by mouth 2 (two) times daily. Patient not taking: Reported on 02/06/2017 03/05/13   Geoffery Lyonsouglas Delo, MD  oxyCODONE-acetaminophen (ROXICET) 5-325 MG tablet Take 1 tablet by mouth every 4 (four) hours as needed for severe pain. Patient not taking: Reported on 02/06/2017 09/11/16   Darci Currentandolph N Brown, MD    Inpatient Medications:  . apixaban  5 mg Oral BID  . metoprolol tartrate  25 mg Oral BID   . diltiazem (CARDIZEM) infusion 15 mg/hr (02/06/17 1100)    Allergies:  Allergies  Allergen  Reactions  . Peanut-Containing Drug Products     Social History   Social History  . Marital status: Divorced    Spouse name: N/A  . Number of children: N/A  . Years of education: N/A   Occupational History  . Not on file.   Social History Main Topics  . Smoking status: Current Every Day Smoker    Packs/day: 0.50    Types: Cigarettes  . Smokeless tobacco: Never Used  . Alcohol use No  . 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  Marland Kitchen Sexual activity: Not on file   Other Topics Concern  . Not on file   Social History Narrative  . No narrative on file     Family History  Problem Relation Age  of Onset  . Heart disease Mother   . Diabetes Mother   . Hypertension Father      Review of Systems: Review of Systems  Constitutional: Positive for malaise/fatigue. Negative for chills, diaphoresis, fever and weight loss.  HENT: Negative for congestion.   Eyes: Negative for discharge and redness.  Respiratory: Positive for shortness of breath. Negative for cough, hemoptysis, sputum production and wheezing.   Cardiovascular: Positive for palpitations. Negative for chest pain, orthopnea, claudication, leg swelling and PND.  Gastrointestinal: Negative for abdominal pain, blood in stool, heartburn, melena, nausea and vomiting.  Genitourinary: Negative for hematuria.  Musculoskeletal: Negative for falls and myalgias.  Skin: Negative for rash.  Neurological: Positive for weakness. Negative for dizziness, tingling, tremors, sensory change, speech change, focal weakness and loss of consciousness.  Endo/Heme/Allergies: Does not bruise/bleed easily.  Psychiatric/Behavioral: Negative for substance abuse. The patient is not nervous/anxious.   All other systems reviewed and are negative.   Labs:  Recent Labs  02/06/17 1018  TROPONINI 0.03*   Lab Results  Component Value Date   WBC 8.5 02/06/2017   HGB 17.3 02/06/2017   HCT 51.6 02/06/2017   MCV 88.4 02/06/2017   PLT 192 02/06/2017     Recent Labs Lab 02/06/17 1018  NA 139  K 4.2  CL 107  CO2 22  BUN 10  CREATININE 1.06  CALCIUM 8.7*  PROT 6.9  BILITOT 0.4  ALKPHOS 83  ALT 11*  AST 22  GLUCOSE 99   Lab Results  Component Value Date   CHOL 240 (A) 12/21/2014   HDL 30 (A) 12/21/2014   LDLCALC 144 12/21/2014   TRIG 331 (A) 12/21/2014   No results found for: DDIMER  Radiology/Studies:  Dg Chest Portable 1 View  Result Date: 02/06/2017 CLINICAL DATA:  Tachycardia and chest discomfort EXAM: PORTABLE CHEST 1 VIEW COMPARISON:  None. FINDINGS: There is minimal scarring in the left base. Lungs elsewhere are clear. Heart is  slightly enlarged with pulmonary vascularity within normal limits. No adenopathy. There is atherosclerotic calcification aorta. No bone lesions. IMPRESSION: Slight scarring left base. No edema or consolidation. Mild cardiac enlargement. Aortic atherosclerosis. Electronically Signed   By: Bretta Bang III M.D.   On: 02/06/2017 10:45    EKG: Interpreted by me showed: typical atrial flutter with RVR at 2:1 AV conduction, 154 bpm, LVH, nonspecific st/t changes  Telemetry: Interpreted by me showed: atrial flutter 150's bpm, converting to junctional rhythm at 12:14 followed by short run of atrial tachycardia at 12:15 PM with conversion to sinus rhythm in the 60's bpm at 12:15 PM and has remained in sinus rhythm since with frequent PVCs  Weights: Filed Weights   02/06/17 1017  Weight: 190 lb 7.6 oz (86.4 kg)     Physical Exam: Blood pressure (!) 139/91, pulse (!) 42, temperature 98.3 F (36.8 C), temperature source Oral, resp. rate 19, weight 190 lb 7.6 oz (86.4 kg), SpO2 95 %. Body mass index is 25.13 kg/m. General: Well developed, well nourished, in no acute distress. Resting tremor noted.  Head: Normocephalic, atraumatic, sclera non-icteric, no xanthomas, nares are without discharge.  Neck: Negative for carotid bruits. JVD not elevated. Lungs: Clear bilaterally to auscultation without wheezes, rales, or rhonchi. Breathing is unlabored. Heart: RRR with S1 S2. No murmurs, rubs, or gallops appreciated. Abdomen: Soft, non-tender, non-distended with normoactive bowel sounds. No hepatomegaly. No rebound/guarding. No obvious abdominal masses. Msk:  Strength and tone appear normal for age. Extremities: No clubbing or cyanosis. No edema. Distal pedal pulses are 2+ and equal bilaterally. Neuro: Alert and oriented X 3. No facial asymmetry. No focal deficit. Moves all extremities spontaneously. Psych:  Responds to questions appropriately with a normal affect.    Assessment and Plan:  Principal  Problem:   New onset atrial flutter (HCC) Active Problems:   Atrial flutter with rapid ventricular response (HCC)   Hypertension   Tobacco abuse    1. New onset typical atrial flutter with RVR with 2:1 AV conduction: -Now in sinus rhythm with heart rate in the 60's as above  -Transition diltiazem gtt to PO Lopressor 25 mg bid -Continue Eliquis 5 mg bid given CHADS2VASc of at least 1 (HTN) -Will need outpatient follow up with cardiology for echo and possible stress testing -Recommend decreasing [facility] intake  -Check 12-lead EKG  2. HTN: -Lopressor as above -Resume lisinopril  3. Tobacco abuse: -Cessation advised  4. Dispo: -Patient can be discharged today, will defer to IM   Signed, Eula Listen, PA-C [facility] Pager: [phone] 02/06/2017, 2:34 PM

## 2017-02-06 NOTE — ED Triage Notes (Signed)
Pt states he felt as if his heart was racing last night, states hx of being on beta blockers in the past but not on any currently, states he noticed his BP was high at home, pt taken to room, EDP at bedside

## 2017-02-06 NOTE — H&P (Signed)
[facility] [place] [facility]   PAT[facility]: [facility]    MR#:  [id number]  DATE OF BIRTH:  [birth year]  DATE OF ADMISSION:  02/06/2017  PRIMARY CARE PHYSICIAN: No PCP Per Patient   REQUESTING/REFERRING PHYSICIAN: Dr. Willy EddyPatrick Robinson  CHIEF COMPLAINT:palpitations    Chief Complaint  Patient presents with  . Palpitations    HISTORY OF PRESENT ILLNESS:  Alex Winters  is a 61 y.o. male with a known history ofEssential hypertension, supposed to be on beta blockers but not taking them recently comes in because of palpitations. Patient having palpitations for the last 2 days. Denies chest pain or dizziness. No shortness of breath or cough. Patient heart rate 155 and he came with atrial flutter with 2 :1block. He is on Cardizem drip at 15 mg an hour but still heart rate is up to 150.bpm.  PAST MEDICAL HISTORY:   Past Medical History:  Diagnosis Date  . Allergy    Nuts  . Hypertension     PAST SURGICAL HISTOIRY:   Past Surgical History:  Procedure Laterality Date  . KNEE SURGERY    . TONSILLECTOMY      SOCIAL HISTORY:   Social History  Substance Use Topics  . Smoking status: Current Every Day Smoker    Packs/day: 0.50    Types: Cigarettes  . Smokeless tobacco: Never Used  . Alcohol use No    FAMILY HISTORY:   Family History  Problem Relation Age of Onset  . Heart disease Mother   . Diabetes Mother   . Hypertension Father     DRUG ALLERGIES:   Allergies  Allergen Reactions  . Peanut-Containing Drug Products     REVIEW OF SYSTEMS:  CONSTITUTIONAL: No fever, fatigue or weakness.  EYES: No blurred or double vision.  EARS, NOSE, AND THROAT: No tinnitus or ear pain.  RESPIRATORY: No cough, shortness of breath, wheezing or hemoptysis.  CARDIOVASCULAR: No chest pain, orthopnea, edema. Has palpitations. GASTROINTESTINAL: No nausea, vomiting, diarrhea or abdominal pain.  GENITOURINARY: No dysuria, hematuria.  ENDOCRINE: No  polyuria, nocturia,  HEMATOLOGY: No anemia, easy bruising or bleeding SKIN: No rash or lesion. MUSCULOSKELETAL: No joint pain or arthritis.   NEUROLOGIC: No tingling, numbness, weakness.  PSYCHIATRY: No anxiety or depression.   MEDICATIONS AT HOME:   Prior to Admission medications   Medication Sig Start Date End Date Taking? Authorizing Provider  amLODipine (NORVASC) 5 MG tablet Take 1 tablet (5 mg total) by mouth daily. 09/11/16  Yes Darci Currentandolph N Brown, MD  lisinopril (PRINIVIL,ZESTRIL) 10 MG tablet Take 4 tablets (40 mg total) by mouth daily. 09/11/16  Yes Darci Currentandolph N Brown, MD  metoprolol (LOPRESSOR) 50 MG tablet Take 1 tablet (50 mg total) by mouth 2 (two) times daily. Patient not taking: Reported on 02/06/2017 03/05/13   Geoffery Lyonsouglas Delo, MD  oxyCODONE-acetaminophen (ROXICET) 5-325 MG tablet Take 1 tablet by mouth every 4 (four) hours as needed for severe pain. Patient not taking: Reported on 02/06/2017 09/11/16   Darci Currentandolph N Brown, MD      VITAL SIGNS:  Blood pressure (!) 140/112, pulse (!) 155, temperature 98.3 F (36.8 C), temperature source Oral, resp. rate 20, weight 86.4 kg (190 lb 7.6 oz), SpO2 96 %.  PHYSICAL EXAMINATION:  GENERAL:  61 y.o.-year-old patient lying in the bed with no acute distress.  EYES: Pupils equal, round, reactive to light and accommodation. No scleral icterus. Extraocular muscles intact.  HEENT: Head atraumatic, normocephalic. Oropharynx and nasopharynx clear.  NECK:  Supple, no jugular venous distention. No thyroid enlargement, no tenderness.  LUNGS: Normal breath sounds bilaterally, no wheezing, rales,rhonchi or crepitation. No use of accessory muscles of respiration.  CARDIOVASCULAR: S1-S2 irregularly irregular.  ABDOMEN: Soft, nontender, nondistended. Bowel sounds present. No organomegaly or mass.  EXTREMITIES: No pedal edema, cyanosis, or clubbing.  NEUROLOGIC: Cranial nerves II through XII are intact. Muscle strength 5/5 in all extremities. Sensation intact.  Gait not checked.  PSYCHIATRIC: The patient is alert and oriented x 3.  SKIN: No obvious rash, lesion, or ulcer.   LABORATORY PANEL:   CBC  Recent Labs Lab 02/06/17 1018  WBC 8.5  HGB 17.3  HCT 51.6  PLT 192   ------------------------------------------------------------------------------------------------------------------  Chemistries  No results for input(s): NA, K, CL, CO2, GLUCOSE, BUN, CREATININE, CALCIUM, MG, AST, ALT, ALKPHOS, BILITOT in the last 168 hours.  Invalid input(s): GFRCGP ------------------------------------------------------------------------------------------------------------------  Cardiac Enzymes No results for input(s): TROPONINI in the last 168 hours. ------------------------------------------------------------------------------------------------------------------  RADIOLOGY:  Dg Chest Portable 1 View  Result Date: 02/06/2017 CLINICAL DATA:  Tachycardia and chest discomfort EXAM: PORTABLE CHEST 1 VIEW COMPARISON:  None. FINDINGS: There is minimal scarring in the left base. Lungs elsewhere are clear. Heart is slightly enlarged with pulmonary vascularity within normal limits. No adenopathy. There is atherosclerotic calcification aorta. No bone lesions. IMPRESSION: Slight scarring left base. No edema or consolidation. Mild cardiac enlargement. Aortic atherosclerosis. Electronically Signed   By: Bretta Bang III M.D.   On: 02/06/2017 10:45    EKG:   Orders placed or performed during the hospital encounter of 02/06/17  . EKG 12-Lead  . EKG 12-Lead   Atrial flutter with 1 54 bpm BPM with2:1 AV conduction IMPRESSION AND PLAN:   #61 year old male patient with palpitations, known to have atrial flutter. New onset.  #1/new onset atrial flutter: Admit to telemetry, continue Cardizem, titrated to drip up to 20 mg an hour, spoke with cardiology Dr.End from  [facility], ,he recommended NOAC like eliquis  or Xarelto, Echocardiogram, TSH.  #2 heavy tobacco  abuse; with 42 pack years. Counseled to quit, offered assistance, counselled for about 10 minutes. D/w cardiology  All the records are reviewed and case discussed with ED provider. Management plans discussed with the patient, family and they are in agreement.  CODE STATUS:full  TOTAL TIME TAKING CARE OF THIS PATIENT: 55(CCT) minutes.    Katha Hamming M.D on 02/06/2017 at 12:07 PM  Between 7am to 6pm - Pager - [phone]  After 6pm go to [email or web address] - password [email or web address] [facility]  [facility]  Office  [phone]  CC: Primary care physician; No PCP Per Patient  Note: This dictation was prepared with Dragon dictation along with smaller phrase technology. Any transcriptional errors that result from this process are unintentional.

## 2017-02-06 NOTE — Progress Notes (Signed)
ANTICOAGULATION CONSULT NOTE - Initial Consult  Pharmacy Consult for Eliquis (Apixaban)  Indication: atrial fibrillation/Flutter   Allergies  Allergen Reactions  . Peanut-Containing Drug Products     Patient Measurements: Weight: 190 lb 7.6 oz (86.4 kg)  Vital Signs: Temp: 98.3 F (36.8 C) (02/07 1016) Temp Source: Oral (02/07 1016) BP: 139/91 (02/07 1200) Pulse Rate: 42 (02/07 1200)  Labs:  Recent Labs  02/06/17 1018  HGB 17.3  HCT 51.6  PLT 192  APTT 25  LABPROT 12.3  INR 0.92   CrCl cannot be calculated (Patient's most recent lab result is older than the maximum 21 days allowed.).  Medical History: Past Medical History:  Diagnosis Date  . Allergy    Nuts  . Hypertension     Assessment: 61 yo male with new onset atrial flutter. Pharmacy consulted for apixaban dosing.   Plan:  Will start patient on Apixaban 5mg  BID.  Monitor patient for S/Sx of bleeding.  Plan for Apixaban counseling by pharmacy  prior to discharge.    Gardner CandleSheema M Ciel Chervenak, PharmD, BCPS Clinical Pharmacist 02/06/2017 12:29 PM

## 2017-02-06 NOTE — Plan of Care (Signed)
Problem: Activity: Goal: Ability to tolerate increased activity will improve Outcome: Progressing Pt tolerating activitiy well/ heart rate remains controlled and in NSR

## 2017-02-06 NOTE — ED Provider Notes (Signed)
[facility] Emergency Department Provider Note    None    (approximate)  I have reviewed the triage vital signs and the nursing notes.   HISTORY  Chief Complaint Palpitations    HPI Alex Winters is a 61 y.o. male no known history of dysrhythmia presents with palpitations and benign off for the past 2-3 days. Patient states he woke up yesterday morning while going to urinate and had palpitations and feeling that his heart was about to jump out of his chest. States that he went to work and felt the similar symptoms throughout the day. This morning woke up with similar discomfort and called his ex-wife who is a cardiac nurse who told him come to the ER. He denies any active chest pain. He's been able to ambulate with a steady gait. Intermittently will feel lightheaded but denies any pressure. Denies any nausea or vomiting. No diarrhea. Normal oral intake. He is a heavy smoker. Does have a significant family history of heart disease. Has never seen a cardiologist.   Past Medical History:  Diagnosis Date  . Allergy    Nuts  . Hypertension    Family History  Problem Relation Age of Onset  . Heart disease Mother   . Diabetes Mother   . Hypertension Father    Past Surgical History:  Procedure Laterality Date  . KNEE SURGERY    . TONSILLECTOMY     Patient Active Problem List   Diagnosis Date Noted  . Hyperlipidemia 12/28/2014  . Hypertension 06/30/2013      Prior to Admission medications   Medication Sig Start Date End Date Taking? Authorizing Provider  amLODipine (NORVASC) 5 MG tablet Take 1 tablet (5 mg total) by mouth daily. 09/11/16   Darci Current, MD  FLUoxetine (PROZAC) 20 MG capsule Take 20 mg by mouth daily.    Historical Provider, MD  lisinopril (PRINIVIL,ZESTRIL) 10 MG tablet Take 4 tablets (40 mg total) by mouth daily. 09/11/16   Darci Current, MD  metoprolol (LOPRESSOR) 50 MG tablet Take 1 tablet (50 mg total) by mouth 2 (two)  times daily. Patient taking differently: Take 25 mg by mouth 2 (two) times daily.  03/05/13   Geoffery Lyons, MD  oxyCODONE-acetaminophen (ROXICET) 5-325 MG tablet Take 1 tablet by mouth every 4 (four) hours as needed for severe pain. 09/11/16   Darci Current, MD    Allergies Peanut-containing drug products    Social History Social History  Substance Use Topics  . Smoking status: Current Every Day Smoker    Packs/day: 0.50    Types: Cigarettes  . Smokeless tobacco: Never Used  . Alcohol use No    Review of Systems Patient denies headaches, rhinorrhea, blurry vision, numbness, shortness of breath, chest pain, edema, cough, abdominal pain, nausea, vomiting, diarrhea, dysuria, fevers, rashes or hallucinations unless otherwise stated above in HPI. ____________________________________________   PHYSICAL EXAM:  VITAL SIGNS: Vitals:   02/06/17 1045 02/06/17 1100  BP: (!) 131/106 (!) 143/97  Pulse: (!) 154 (!) 157  Resp: (!) 27 (!) 28  Temp:      Constitutional: Alert and oriented. Well appearing and in no acute distress. Eyes: Conjunctivae are normal. PERRL. EOMI. Head: Atraumatic. Nose: No congestion/rhinnorhea. Mouth/Throat: Mucous membranes are moist.  Oropharynx non-erythematous. Neck: No stridor. Painless ROM. No cervical spine tenderness to palpation Hematological/Lymphatic/Immunilogical: No cervical lymphadenopathy. Cardiovascular:tachycardic rate, regular rhythm. Grossly normal heart sounds.  Good peripheral circulation. Respiratory: Normal respiratory effort.  No retractions. Lungs CTAB.  Gastrointestinal: Soft and nontender. No distention. No abdominal bruits. No CVA tenderness. Genitourinary:  Musculoskeletal: No lower extremity tenderness nor edema.  No joint effusions. Neurologic:  Normal speech and language. No gross focal neurologic deficits are appreciated. No gait instability. Skin:  Skin is warm, dry and intact. No rash noted. Psychiatric: Mood and affect are  normal. Speech and behavior are normal.  ____________________________________________   LABS (all labs ordered are listed, but only abnormal results are displayed)  Results for orders placed or performed during the hospital encounter of 02/06/17 (from the past 24 hour(s))  CBC with Differential/Platelet     Status: None   Collection Time: 02/06/17 10:18 AM  Result Value Ref Range   WBC 8.5 3.8 - 10.6 K/uL   RBC 5.84 4.40 - 5.90 MIL/uL   Hemoglobin 17.3 13.0 - 18.0 g/dL   HCT 60.451.6 54.040.0 - 98.152.0 %   MCV 88.4 80.0 - 100.0 fL   MCH 29.7 26.0 - 34.0 pg   MCHC 33.6 32.0 - 36.0 g/dL   RDW 19.113.9 47.811.5 - 29.514.5 %   Platelets 192 150 - 440 K/uL   Neutrophils Relative % 65 %   Neutro Abs 5.5 1.4 - 6.5 K/uL   Lymphocytes Relative 25 %   Lymphs Abs 2.1 1.0 - 3.6 K/uL   Monocytes Relative 8 %   Monocytes Absolute 0.6 0.2 - 1.0 K/uL   Eosinophils Relative 1 %   Eosinophils Absolute 0.1 0 - 0.7 K/uL   Basophils Relative 1 %   Basophils Absolute 0.1 0 - 0.1 K/uL   ____________________________________________  EKG My review and personal interpretation at Time: 10:05   Indication: palpitations  Rate: 155  Rhythm: aflutter w rvr Axis: normal Other:  Non specific st changes likely rate depndent ____________________________________________  RADIOLOGY  I personally reviewed all radiographic images ordered to evaluate for the above acute complaints and reviewed radiology reports and findings.  These findings were personally discussed with the patient.  Please see medical record for radiology report.  ____________________________________________   PROCEDURES  Procedure(s) performed:  Procedures    Critical Care performed: yes CRITICAL CARE Performed by: Willy EddyPatrick Loida Calamia   Total critical care time: 45 minutes  Critical care time was exclusive of separately billable procedures and treating other patients.  Critical care was necessary to treat or prevent imminent or life-threatening  deterioration.  Critical care was time spent personally by me on the following activities: development of treatment plan with patient and/or surrogate as well as nursing, discussions with consultants, evaluation of patient's response to treatment, examination of patient, obtaining history from patient or surrogate, ordering and performing treatments and interventions, ordering and review of laboratory studies, ordering and review of radiographic studies, pulse oximetry and re-evaluation of patient's condition.  ____________________________________________   INITIAL IMPRESSION / ASSESSMENT AND PLAN / ED COURSE  Pertinent labs & imaging results that were available during my care of the patient were reviewed by me and considered in my medical decision making (see chart for details).  DDX: ACS, pericarditis, esophagitis, boerhaaves, pe, dissection, pna, bronchitis, costochondritis   Alex Winters is a 61 y.o. who presents to the ED with atrial flutter with rapid ventricular response. Patient otherwise hemodynamic stable in no acute distress. We'll check laboratory evaluation. We'll provide IV fluids for dehydration. Will start patient on Cardizem bolus and drip.  The patient will be placed on continuous pulse oximetry and telemetry for monitoring.  Laboratory evaluation will be sent to evaluate for the above complaints.  Clinical Course as of Feb 07 1128  Wed Feb 06, 2017  1103 Patient recheck. On Cardizem drip the heart rate is intermittently breaking with a heart rate down to 1 teens with evidence of a flutter but then will go back up to 154. Will continue Cardizem infusion. Patient states he feels much better with rate control.  [PR]    Clinical Course User Index [PR] Willy Eddy, MD    ----------------------------------------- 11:30 AM on 02/06/2017 -----------------------------------------  I spoke with Dr. Okey Dupre of cardiology.  Agrees with cardizem for now.  Rc heparin for  probable TEE and cardioversion in the Am.  Spoke with hospitalist who kidnly agrees to admit for further evaluation and management.  Have discussed with the patient and available family all diagnostics and treatments performed thus far and all questions were answered to the best of my ability. The patient demonstrates understanding and agreement with plan.  ____________________________________________   FINAL CLINICAL IMPRESSION(S) / ED DIAGNOSES  Final diagnoses:  Atrial flutter with rapid ventricular response (HCC)      NEW MEDICATIONS STARTED DURING THIS VISIT:  New Prescriptions   No medications on file     Note:  This document was prepared using Dragon voice recognition software and may include unintentional dictation errors.    Willy Eddy, MD 02/06/17 [phone]

## 2017-02-07 ENCOUNTER — Telehealth: Payer: Self-pay | Admitting: Nurse Practitioner

## 2017-02-07 DIAGNOSIS — I4892 Unspecified atrial flutter: Secondary | ICD-10-CM

## 2017-02-07 LAB — CBC
HEMATOCRIT: 45.1 % (ref 40.0–52.0)
Hemoglobin: 15.1 g/dL (ref 13.0–18.0)
MCH: 30.1 pg (ref 26.0–34.0)
MCHC: 33.6 g/dL (ref 32.0–36.0)
MCV: 89.5 fL (ref 80.0–100.0)
Platelets: 153 10*3/uL (ref 150–440)
RBC: 5.04 MIL/uL (ref 4.40–5.90)
RDW: 14.1 % (ref 11.5–14.5)
WBC: 6.4 10*3/uL (ref 3.8–10.6)

## 2017-02-07 LAB — BASIC METABOLIC PANEL
ANION GAP: 6 (ref 5–15)
BUN: 11 mg/dL (ref 6–20)
CO2: 23 mmol/L (ref 22–32)
Calcium: 8.3 mg/dL — ABNORMAL LOW (ref 8.9–10.3)
Chloride: 108 mmol/L (ref 101–111)
Creatinine, Ser: 0.71 mg/dL (ref 0.61–1.24)
Glucose, Bld: 90 mg/dL (ref 65–99)
POTASSIUM: 4.1 mmol/L (ref 3.5–5.1)
SODIUM: 137 mmol/L (ref 135–145)

## 2017-02-07 LAB — GLUCOSE, CAPILLARY: Glucose-Capillary: 79 mg/dL (ref 65–99)

## 2017-02-07 LAB — ECHOCARDIOGRAM COMPLETE: WEIGHTICAEL: 3041.6 [oz_av]

## 2017-02-07 MED ORDER — METOPROLOL TARTRATE 25 MG PO TABS: 25.0000 mg | ORAL_TABLET | Freq: Two times a day (BID) | ORAL | 2 refills | Status: DC

## 2017-02-07 MED ORDER — APIXABAN 5 MG PO TABS: 5.0000 mg | ORAL_TABLET | Freq: Two times a day (BID) | ORAL | 2 refills | Status: DC

## 2017-02-07 NOTE — Discharge Instructions (Signed)
Heart healthy diet. °Smoking cessation. °

## 2017-02-07 NOTE — Care Management (Signed)
Provided patient with the Eliquis Patient Assistance application with competed prescriber portion.  Instructed patient to complete his portion and mail asap.  Patient verbalized understanding.

## 2017-02-07 NOTE — Progress Notes (Signed)
Patient: Alex Winters / Admit Date: 02/06/2017 / Date of Encounter: 02/07/2017, 7:25 PM   Hospital Problem List     Principal Problem:   New onset atrial flutter (HCC) Active Problems:   Hypertension   Atrial flutter with rapid ventricular response (HCC)   Tobacco abuse     Subjective   Patient reports that he feels well this morning Needs to leave the hospital today, has to go back to work Denies chest pain, shortness of breath, slept well Ambulated without significant problems Telemetry reviewed showing normal sinus rhythm Tolerated start of metoprolol, eliquis    Objective: Telemetry: Normal sinus rhythm Physical Exam: Blood pressure (!) 151/82, pulse 71, temperature 97.6 F (36.4 C), temperature source Oral, resp. rate 18, weight 187 lb 6.4 oz (85 kg), SpO2 97 %. Body mass index is 24.72 kg/m. GEN: Well nourished, well developed, in no acute distress.  HEENT: Grossly normal.  Neck: Supple, no JVD, carotid bruits, or masses. Cardiac: RRR, no murmurs, rubs, or gallops. No clubbing, cyanosis, edema.  Radials/DP/PT 2+ and equal bilaterally.  Respiratory:  Respirations regular and unlabored, mildly decreased breath sounds throughout, otherwise clear to auscultation bilaterally. GI: Soft, nontender, nondistended, BS + x 4. MS: no deformity or atrophy. Skin: warm and dry, no rash. Neuro:  Strength and sensation are intact. Psych: AAOx3.  Normal affect.   Intake/Output Summary (Last 24 hours) at 02/07/17 1925 Last data filed at 02/07/17 1031  Gross per 24 hour  Intake              600 ml  Output             1150 ml  Net             -550 ml     Labs: CBC  Recent Labs  02/06/17 1018 02/07/17 0322  WBC 8.5 6.4  NEUTROABS 5.5  --   HGB 17.3 15.1  HCT 51.6 45.1  MCV 88.4 89.5  PLT 192 153   Basic Metabolic Panel  Recent Labs  02/06/17 1018 02/07/17 0322  NA 139 137  K 4.2 4.1  CL 107 108  CO2 22 23  GLUCOSE 99 90  BUN 10 11  CREATININE 1.06  0.71  CALCIUM 8.7* 8.3*  MG 2.0  --    Liver Function Tests  Recent Labs  02/06/17 1018  AST 22  ALT 11*  ALKPHOS 83  BILITOT 0.4  PROT 6.9  ALBUMIN 3.8   No results for input(s): LIPASE, AMYLASE in the last 72 hours. Cardiac Enzymes  Recent Labs  02/06/17 1018  TROPONINI 0.03*   BNP Invalid input(s): POCBNP D-Dimer No results for input(s): DDIMER in the last 72 hours. Hemoglobin A1C No results for input(s): HGBA1C in the last 72 hours. Fasting Lipid Panel No results for input(s): CHOL, HDL, LDLCALC, TRIG, CHOLHDL, LDLDIRECT in the last 72 hours. Thyroid Function Tests  Recent Labs  02/06/17 1018  TSH 2.379    Weights: Filed Weights   02/06/17 1017 02/06/17 1544 02/07/17 0505  Weight: 190 lb 7.6 oz (86.4 kg) 190 lb 1.6 oz (86.2 kg) 187 lb 6.4 oz (85 kg)     Radiology/Studies:  Dg Chest Portable 1 View  Result Date: 02/06/2017 CLINICAL DATA:  Tachycardia and chest discomfort EXAM: PORTABLE CHEST 1 VIEW COMPARISON:  None. FINDINGS: There is minimal scarring in the left base. Lungs elsewhere are clear. Heart is slightly enlarged with pulmonary vascularity within normal limits. No adenopathy. There is atherosclerotic calcification  aorta. No bone lesions. IMPRESSION: Slight scarring left base. No edema or consolidation. Mild cardiac enlargement. Aortic atherosclerosis. Electronically Signed   By: Bretta Bang III M.D.   On: 02/06/2017 10:45     Assessment and Plan  61 y.o. male   1. New onset typical atrial flutter with RVR with 2:1 AV conduction: Maintaining normal sinus rhythm overnight Tolerating Lopressor 25 mg bid -Continue Eliquis 5 mg bid   CHADS2VASc of at least 1 (HTN) Echocardiogram with normal ejection fraction greater than 55%, mildly dilated left atrium  2. HTN: -Lopressor ,  lisinopril  3. Tobacco abuse: We have encouraged him to continue to work on weaning his cigarettes and smoking cessation. He will continue to work on this and does  not want any assistance with chantix.   Signed, Dossie Arbour, MD, Ph.D. [facility] 02/07/2017, 7:25 PM

## 2017-02-07 NOTE — Progress Notes (Signed)
Discharge instructions explained to pt/ verbalized an understanding/ iv and tele removed/ RX given to pt/ assistance with med with care mang/ pt refusing wheelchair/ ambulated off unit.

## 2017-02-07 NOTE — Telephone Encounter (Signed)
[facility].  Patient understands to follow up with Ward Givenshris Berge, NP on 02/22/17 at 1:30 at [facility]. Patient understands discharge instructions? yes Patient understands medications and regiment? yes Patient understands to bring all medications to this visit? yes  Pt reports that he is doing well, even went to work today.

## 2017-02-07 NOTE — Telephone Encounter (Signed)
TCM..the patient is being discharged today  He has an appointment on 02/22/17 to see Ward GivensChris Berge He saw Alycia RossettiRyan in hospital

## 2017-02-07 NOTE — Care Management Note (Signed)
Case Management Note  Patient Details  Name: Alex Winters MRN: [id number] Date of Birth: 03-27-1956  Patient admitted with new onset atrial fib initially requiring cardizem infusion.  Patient to discharge home on Eliquis.  Provided patient Eliquis coupon for 30 day trial, application for [employer] patient assistance, [facility].  Have placed physician portion of Eliquis application in shadow chart for cardiologist to complete  Expected Discharge Date:                  Expected Discharge Plan:  Home/Self Care  In-House Referral:     Discharge planning Services  CM Consult, Indigent Health Clinic, Medication Assistance  Post Acute Care Choice:    Choice offered to:     DME Arranged:    DME Agency:     HH Arranged:    HH Agency:     Status of Service:  In process, will continue to follow  If discussed at Long Length of Stay Meetings, dates discussed:    Additional Comments:  Eber HongGreene, Jaelie Aguilera R, RN 02/07/2017, 9:52 AM

## 2017-02-07 NOTE — Plan of Care (Signed)
Problem: Cardiac: Goal: Ability to achieve and maintain adequate cardiopulmonary perfusion will improve Outcome: Progressing SR maintained on telemetry this shift.

## 2017-02-07 NOTE — Discharge Summary (Addendum)
[facility] [place] [facility]   PATIENT NAME: Alex Winters    MR#:  [id number]  DATE OF BIRTH:  1956/08/19  DATE OF ADMISSION:  02/06/2017   ADMITTING PHYSICIAN: Katha HammingSnehalatha Konidena, MD  DATE OF DISCHARGE: 02/07/2017 11:26 AM  PRIMARY CARE PHYSICIAN: No PCP Per Patient   ADMISSION DIAGNOSIS:  Atrial flutter with rapid ventricular response (HCC) [I48.92] DISCHARGE DIAGNOSIS:  Principal Problem:   New onset atrial flutter (HCC) Active Problems:   Hypertension   Atrial flutter with rapid ventricular response (HCC)   Tobacco abuse  SECONDARY DIAGNOSIS:   Past Medical History:  Diagnosis Date  . Allergy    Nuts  . Hypertension    HOSPITAL COURSE:  61 year old male patient with palpitations, known to have atrial flutter. New onset.  #1/new onset atrial flutter: He was on Cardizem drip, spontaneously converted to sinus rhythm in the emergency department on a diltiazem infusion, changed to po lopressor 25 mg bid and started Eliquis 5 mg bid per Dr.End. Echocardiogram showed normal ejection fraction.  Hypertension. Continue home hypertension medication.  #2 heavy tobacco abuse; with 42 pack years. Smoking cessation was Counseled for 3-4 minutes.  DISCHARGE CONDITIONS:  Stable, dischargef to home today. CONSULTS OBTAINED:  Treatment Team:  Yvonne Kendallhristopher End, MD DRUG ALLERGIES:   Allergies  Allergen Reactions  . Peanut-Containing Drug Products    DISCHARGE MEDICATIONS:   Allergies as of 02/07/2017      Reactions   Peanut-containing Drug Products       Medication List    TAKE these medications   amLODipine 5 MG tablet Commonly known as:  NORVASC Take 1 tablet (5 mg total) by mouth daily.   apixaban 5 MG Tabs tablet Commonly known as:  ELIQUIS Take 1 tablet (5 mg total) by mouth 2 (two) times daily.   lisinopril 10 MG tablet Commonly known as:  PRINIVIL,ZESTRIL Take 4 tablets (40 mg total) by mouth daily.   metoprolol tartrate 25  MG tablet Commonly known as:  LOPRESSOR Take 1 tablet (25 mg total) by mouth 2 (two) times daily. What changed:  medication strength  how much to take   oxyCODONE-acetaminophen 5-325 MG tablet Commonly known as:  ROXICET Take 1 tablet by mouth every 4 (four) hours as needed for severe pain.        DISCHARGE INSTRUCTIONS:  See AVS.  If you experience worsening of your admission symptoms, develop shortness of breath, life threatening emergency, suicidal or homicidal thoughts you must seek medical attention immediately by calling 911 or calling your MD immediately  if symptoms less severe.  You Must read complete instructions/literature along with all the possible adverse reactions/side effects for all the Medicines you take and that have been prescribed to you. Take any new Medicines after you have completely understood and accpet all the possible adverse reactions/side effects.   Please note  You were cared for by a hospitalist during your hospital stay. If you have any questions about your discharge medications or the care you received while you were in the hospital after you are discharged, you can call the unit and asked to speak with the hospitalist on call if the hospitalist that took care of you is not available. Once you are discharged, your primary care physician will handle any further medical issues. Please note that NO REFILLS for any discharge medications will be authorized once you are discharged, as it is imperative that you return to your primary care physician (or establish a relationship with  a primary care physician if you do not have one) for your aftercare needs so that they can reassess your need for medications and monitor your lab values.    On the day of Discharge:  VITAL SIGNS:  Blood pressure (!) 151/82, pulse 71, temperature 97.6 F (36.4 C), temperature source Oral, resp. rate 18, weight 187 lb 6.4 oz (85 kg), SpO2 97 %. PHYSICAL EXAMINATION:  GENERAL:   61 y.o.-year-old patient lying in the bed with no acute distress.  EYES: Pupils equal, round, reactive to light and accommodation. No scleral icterus. Extraocular muscles intact.  HEENT: Head atraumatic, normocephalic. Oropharynx and nasopharynx clear.  NECK:  Supple, no jugular venous distention. No thyroid enlargement, no tenderness.  LUNGS: Normal breath sounds bilaterally, no wheezing, rales,rhonchi or crepitation. No use of accessory muscles of respiration.  CARDIOVASCULAR: S1, S2 normal. No murmurs, rubs, or gallops.  ABDOMEN: Soft, non-tender, non-distended. Bowel sounds present. No organomegaly or mass.  EXTREMITIES: No pedal edema, cyanosis, or clubbing.  NEUROLOGIC: Cranial nerves II through XII are intact. Muscle strength 5/5 in all extremities. Sensation intact. Gait not checked.  PSYCHIATRIC: The patient is alert and oriented x 3.  SKIN: No obvious rash, lesion, or ulcer.  DATA REVIEW:   CBC  Recent Labs Lab 02/07/17 0322  WBC 6.4  HGB 15.1  HCT 45.1  PLT 153    Chemistries   Recent Labs Lab 02/06/17 1018 02/07/17 0322  NA 139 137  K 4.2 4.1  CL 107 108  CO2 22 23  GLUCOSE 99 90  BUN 10 11  CREATININE 1.06 0.71  CALCIUM 8.7* 8.3*  MG 2.0  --   AST 22  --   ALT 11*  --   ALKPHOS 83  --   BILITOT 0.4  --      Microbiology Results  No results found for this or any previous visit.  RADIOLOGY:  No results found.   Management plans discussed with the patient, family and they are in agreement.  CODE STATUS:     Code Status Orders        Start     Ordered   02/06/17 1202  Full code  Continuous     02/06/17 1204    Code Status History    Date Active Date Inactive Code Status Order ID Comments User Context   This patient has a current code status but no historical code status.      TOTAL TIME TAKING CARE OF THIS PATIENT: 32 minutes.    Shaune Pollack M.D on 02/07/2017 at 2:21 PM  Between 7am to 6pm - Pager - [phone]  After 6pm go to  [email or web address] - Scientist, research (life sciences) [facility]  Office  [phone]  CC: Primary care physician; No PCP Per Patient   Note: This dictation was prepared with Dragon dictation along with smaller phrase technology. Any transcriptional errors that result from this process are unintentional.

## 2017-02-22 ENCOUNTER — Encounter: Payer: Self-pay | Admitting: Nurse Practitioner

## 2017-03-08 ENCOUNTER — Encounter: Payer: Self-pay | Admitting: Physician Assistant

## 2017-06-09 ENCOUNTER — Emergency Department
Admission: EM | Admit: 2017-06-09 | Discharge: 2017-06-10 | Disposition: A | Discharge status: Home / Self Care | Payer: Self-pay | Attending: Emergency Medicine | Admitting: Emergency Medicine

## 2017-06-09 ENCOUNTER — Encounter: Payer: Self-pay | Admitting: Emergency Medicine

## 2017-06-09 DIAGNOSIS — F17210 Nicotine dependence, cigarettes, uncomplicated: Secondary | ICD-10-CM | POA: Insufficient documentation

## 2017-06-09 DIAGNOSIS — Y929 Unspecified place or not applicable: Secondary | ICD-10-CM | POA: Insufficient documentation

## 2017-06-09 DIAGNOSIS — T464X2A Poisoning by angiotensin-converting-enzyme inhibitors, intentional self-harm, initial encounter: Secondary | ICD-10-CM | POA: Insufficient documentation

## 2017-06-09 DIAGNOSIS — F1410 Cocaine abuse, uncomplicated: Secondary | ICD-10-CM

## 2017-06-09 DIAGNOSIS — T50902A Poisoning by unspecified drugs, medicaments and biological substances, intentional self-harm, initial encounter: Secondary | ICD-10-CM

## 2017-06-09 DIAGNOSIS — I10 Essential (primary) hypertension: Secondary | ICD-10-CM | POA: Insufficient documentation

## 2017-06-09 DIAGNOSIS — R339 Retention of urine, unspecified: Secondary | ICD-10-CM | POA: Insufficient documentation

## 2017-06-09 DIAGNOSIS — F4325 Adjustment disorder with mixed disturbance of emotions and conduct: Secondary | ICD-10-CM

## 2017-06-09 DIAGNOSIS — F1994 Other psychoactive substance use, unspecified with psychoactive substance-induced mood disorder: Secondary | ICD-10-CM

## 2017-06-09 DIAGNOSIS — Z79899 Other long term (current) drug therapy: Secondary | ICD-10-CM | POA: Insufficient documentation

## 2017-06-09 DIAGNOSIS — Y9389 Activity, other specified: Secondary | ICD-10-CM | POA: Insufficient documentation

## 2017-06-09 DIAGNOSIS — F1490 Cocaine use, unspecified, uncomplicated: Secondary | ICD-10-CM | POA: Insufficient documentation

## 2017-06-09 DIAGNOSIS — Y998 Other external cause status: Secondary | ICD-10-CM | POA: Insufficient documentation

## 2017-06-09 DIAGNOSIS — T1491XA Suicide attempt, initial encounter: Secondary | ICD-10-CM | POA: Insufficient documentation

## 2017-06-09 DIAGNOSIS — Z91010 Allergy to peanuts: Secondary | ICD-10-CM | POA: Insufficient documentation

## 2017-06-09 DIAGNOSIS — Z7901 Long term (current) use of anticoagulants: Secondary | ICD-10-CM | POA: Insufficient documentation

## 2017-06-09 DIAGNOSIS — X838XXA Intentional self-harm by other specified means, initial encounter: Secondary | ICD-10-CM | POA: Insufficient documentation

## 2017-06-09 LAB — URINE DRUG SCREEN, QUALITATIVE (ARMC ONLY)
AMPHETAMINES, UR SCREEN: NOT DETECTED
Barbiturates, Ur Screen: NOT DETECTED
Benzodiazepine, Ur Scrn: NOT DETECTED
CANNABINOID 50 NG, UR ~~LOC~~: NOT DETECTED
COCAINE METABOLITE, UR ~~LOC~~: POSITIVE — AB
MDMA (Ecstasy)Ur Screen: NOT DETECTED
Methadone Scn, Ur: NOT DETECTED
OPIATE, UR SCREEN: NOT DETECTED
PHENCYCLIDINE (PCP) UR S: NOT DETECTED
Tricyclic, Ur Screen: POSITIVE — AB

## 2017-06-09 LAB — URINALYSIS, COMPLETE (UACMP) WITH MICROSCOPIC
BACTERIA UA: NONE SEEN
Bilirubin Urine: NEGATIVE
GLUCOSE, UA: NEGATIVE mg/dL
Hgb urine dipstick: NEGATIVE
KETONES UR: NEGATIVE mg/dL
Leukocytes, UA: NEGATIVE
Nitrite: NEGATIVE
PROTEIN: 30 mg/dL — AB
Specific Gravity, Urine: 1.023 (ref 1.005–1.030)
pH: 6 (ref 5.0–8.0)

## 2017-06-09 LAB — COMPREHENSIVE METABOLIC PANEL
ALBUMIN: 4.1 g/dL (ref 3.5–5.0)
ALT: 15 U/L — AB (ref 17–63)
AST: 31 U/L (ref 15–41)
Alkaline Phosphatase: 74 U/L (ref 38–126)
Anion gap: 11 (ref 5–15)
BUN: 22 mg/dL — AB (ref 6–20)
CHLORIDE: 99 mmol/L — AB (ref 101–111)
CO2: 24 mmol/L (ref 22–32)
CREATININE: 0.95 mg/dL (ref 0.61–1.24)
Calcium: 9.1 mg/dL (ref 8.9–10.3)
GFR calc Af Amer: >60 mL/min (ref >60)
GLUCOSE: 105 mg/dL — AB (ref 65–99)
Potassium: 3.7 mmol/L (ref 3.5–5.1)
Sodium: 134 mmol/L — ABNORMAL LOW (ref 135–145)
Total Bilirubin: 0.8 mg/dL (ref 0.3–1.2)
Total Protein: 7.7 g/dL (ref 6.5–8.1)

## 2017-06-09 LAB — ETHANOL

## 2017-06-09 LAB — SALICYLATE LEVEL: Salicylate Lvl: <7 mg/dL (ref 2.8–30.0)

## 2017-06-09 LAB — ACETAMINOPHEN LEVEL: Acetaminophen (Tylenol), Serum: <10 ug/mL — ABNORMAL LOW (ref 10–30)

## 2017-06-09 LAB — CBC
HEMATOCRIT: 54.1 % — AB (ref 40.0–52.0)
HEMOGLOBIN: 18.7 g/dL — AB (ref 13.0–18.0)
MCH: 30 pg (ref 26.0–34.0)
MCHC: 34.7 g/dL (ref 32.0–36.0)
MCV: 86.6 fL (ref 80.0–100.0)
Platelets: 177 10*3/uL (ref 150–440)
RBC: 6.24 MIL/uL — AB (ref 4.40–5.90)
RDW: 13.5 % (ref 11.5–14.5)
WBC: 11 10*3/uL — AB (ref 3.8–10.6)

## 2017-06-09 MED ORDER — NICOTINE 21 MG/24HR TD PT24
21.0000 mg | MEDICATED_PATCH | Freq: Every day | TRANSDERMAL | Status: DC
  Administered 2017-06-09 – 2017-06-10 (×2): 21 mg via TRANSDERMAL
  Filled 2017-06-09: qty 1

## 2017-06-09 MED ORDER — TAMSULOSIN HCL 0.4 MG PO CAPS
0.4000 mg | ORAL_CAPSULE | Freq: Every day | ORAL | Status: DC
  Administered 2017-06-10: 0.4 mg via ORAL
  Filled 2017-06-09: qty 1

## 2017-06-09 MED ORDER — LISINOPRIL 10 MG PO TABS
40.0000 mg | ORAL_TABLET | Freq: Every day | ORAL | Status: DC
  Administered 2017-06-09 – 2017-06-10 (×2): 40 mg via ORAL
  Filled 2017-06-09 (×2): qty 4

## 2017-06-09 MED ORDER — NICOTINE 21 MG/24HR TD PT24
MEDICATED_PATCH | TRANSDERMAL | Status: AC
  Filled 2017-06-09: qty 1

## 2017-06-09 MED ORDER — APIXABAN 5 MG PO TABS
5.0000 mg | ORAL_TABLET | Freq: Two times a day (BID) | ORAL | Status: DC
  Administered 2017-06-09 – 2017-06-10 (×2): 5 mg via ORAL
  Filled 2017-06-09 (×4): qty 1

## 2017-06-09 MED ORDER — AMLODIPINE BESYLATE 5 MG PO TABS
5.0000 mg | ORAL_TABLET | Freq: Every day | ORAL | Status: DC
  Administered 2017-06-09 – 2017-06-10 (×2): 5 mg via ORAL
  Filled 2017-06-09 (×2): qty 1

## 2017-06-09 MED ORDER — METOPROLOL TARTRATE 25 MG PO TABS
25.0000 mg | ORAL_TABLET | Freq: Two times a day (BID) | ORAL | Status: DC
  Administered 2017-06-09 – 2017-06-10 (×2): 25 mg via ORAL
  Filled 2017-06-09 (×2): qty 1

## 2017-06-09 MED ORDER — LORAZEPAM 1 MG PO TABS
ORAL_TABLET | ORAL | Status: AC
  Administered 2017-06-09: 1 mg
  Filled 2017-06-09: qty 1

## 2017-06-09 NOTE — ED Notes (Signed)
Bladder scan preformed >999 mL in bladder. EDP made aware. Moving patient back to main ED to room 24

## 2017-06-09 NOTE — BH Assessment (Signed)
Assessment Note  Alex Winters is an 61 y.o. male. Alex Winters arrived to the ED by way of transportation by his friends.  He reports that he attempted suicide twice.  He reports that a romance and crack cocaine contributed to his suicide attempts.  He reports being depressed for a long time.  He states that he has been going to [facility] and he had a history of depression and substance abuse.  He states that he was on Prozac and he has not had a prescription in the last 3 years.  He reports that he has been isolating himself, not eating (but could be due to crack use), frustrated with his girlfriend, feeling like he is not enough, easily irritated.  He reports symptoms of anxiety, he worries.  His anxiety manifests as "ill tempered", and feeling pulled in different directions, and feeling that he can't get anything under control.  He denied having auditory or visual hallucinations. He denied homicidal ideation or intent. He denied current suicidal ideation.  He stated that on Thursday he to 25 Lisinopril, he then bought 64 gel caps of sleeping medication on Friday.  He reports using Crack. He states his last use was Thursday.    Diagnosis: Depression, Substance Misuse  Past Medical History:  Past Medical History:  Diagnosis Date  . Allergy    Nuts  . Hypertension     Past Surgical History:  Procedure Laterality Date  . KNEE SURGERY    . TONSILLECTOMY      Family History:  Family History  Problem Relation Age of Onset  . Heart disease Mother   . Diabetes Mother   . Hypertension Father     Social History:  reports that he has been smoking Cigarettes.  He has been smoking about 0.50 packs per day. He has never used smokeless tobacco. He reports that he does not drink alcohol or use drugs.  Additional Social History:  Alcohol / Drug Use History of alcohol / drug use?: Yes Substance #1 Name of Substance 1: Crack Cocaine 1 - Age of First Use: 39 1 - Amount (size/oz): "as much as I can  afford" 1 - Frequency: Daily 1 - Last Use / Amount: 06/06/2017  CIWA: CIWA-Ar BP: 134/79 Pulse Rate: 72 COWS:    Allergies:  Allergies  Allergen Reactions  . Peanut-Containing Drug Products     Home Medications:  (Not in a hospital admission)  OB/GYN Status:  No LMP for male patient.  General Assessment Data [facility] Assessment: In system Is this a Tele or Face-to-Face Assessment?: Face-to-Face Is this an Initial Assessment or a Re-assessment for this encounter?: Initial Assessment Marital status: Divorced [place]: n/a Is patient pregnant?: No Pregnancy Status: No Living Arrangements: Non-relatives/Friends Can pt return to current living arrangement?: Yes Admission Status: Involuntary Is patient capable of signing voluntary admission?: Yes Referral Source: Self/Family/Friend Insurance type: None  Medical Screening Exam [facility] Walk-in ONLY) Medical Exam completed: Yes  Crisis Care Plan Living Arrangements: Non-relatives/Friends Legal Guardian: Other: (Self) Name of Psychiatrist: None Name of Therapist: None  Education Status Is patient currently in school?: No Current Grade: n/a Highest grade of school patient has completed: 11th Name of school: [employer] Contact person: n/a  Risk to self with the past 6 months Suicidal Ideation: Yes-Currently Present Has patient been a risk to self within the past 6 months prior to admission? : Yes Suicidal Intent: No-Not Currently/Within Last 6 Months Has patient had any  suicidal intent within the past 6 months prior to admission? : Yes Is patient at risk for suicide?: No (Currently in the hospital) Suicidal Plan?: No Has patient had any suicidal plan within the past 6 months prior to admission? : Yes Access to Means: No What has been your use of drugs/alcohol within the last 12 months?: History of Crack usage Previous Attempts/Gestures: Yes How many times?: 2 Other Self Harm Risks:  denied Triggers for Past Attempts: Unknown Intentional Self Injurious Behavior: None Family Suicide History: No Recent stressful life event(s): Other (Comment) (Relationship) Persecutory voices/beliefs?: No Depression: Yes Depression Symptoms: Loss of interest in usual pleasures, Feeling angry/irritable Substance abuse history and/or treatment for substance abuse?: Yes Suicide prevention information given to non-admitted patients: Not applicable  Risk to Others within the past 6 months Homicidal Ideation: No Does patient have any lifetime risk of violence toward others beyond the six months prior to admission? : No Thoughts of Harm to Others: No Current Homicidal Intent: No Current Homicidal Plan: No Access to Homicidal Means: No Identified Victim: None identified History of harm to others?: No Assessment of Violence: None Noted Violent Behavior Description: None reported Does patient have access to weapons?: Yes (Comment) (knives and tools - no firearms) Criminal Charges Pending?: No Does patient have a court date: No Is patient on probation?: No  Psychosis Hallucinations: None noted Delusions: None noted  Mental Status Report Appearance/Hygiene: In scrubs Eye Contact: Fair Motor Activity: Restlessness Speech: Logical/coherent, Tangential Level of Consciousness: Alert Mood: Depressed Affect: Sad Anxiety Level: Minimal Thought Processes: Coherent Judgement: Partial Orientation: Person, Time, Place, Situation Obsessive Compulsive Thoughts/Behaviors: None  Cognitive Functioning Concentration: Fair Memory: Recent Intact IQ: Average Insight: Fair Impulse Control: Fair Appetite: Good Sleep: Unable to Assess ("Its been crazy") Vegetative Symptoms: None  ADLScreening [facility] Assessment Services) Patient's cognitive ability adequate to safely complete daily activities?: Yes Patient able to express need for assistance with ADLs?: Yes Independently performs ADLs?: Yes  (appropriate for developmental age)  Prior Inpatient Therapy Prior Inpatient Therapy: No Prior Therapy Dates: n/a Prior Therapy Facilty/Provider(s): n/a Reason for Treatment: n/a  Prior Outpatient Therapy Prior Outpatient Therapy: Yes Prior Therapy Dates: 2014 and prior Prior Therapy Facilty/Provider(s): [facility] [place], [facility], [facility] Reason for Treatment: Substance abuse,  Does patient have an ACCT team?: No Does patient have Intensive In-House Services?  : No Does patient have [facility] services? : No Does patient have P4CC services?: No  ADL Screening (condition at time of admission) Patient's cognitive ability adequate to safely complete daily activities?: Yes Patient able to express need for assistance with ADLs?: Yes Independently performs ADLs?: Yes (appropriate for developmental age)       Abuse/Neglect Assessment (Assessment to be complete while patient is alone) Physical Abuse: Denies Verbal Abuse: Denies Sexual Abuse: Denies Exploitation of patient/patient's resources: Denies Self-Neglect: Denies     Merchant navy officer (For Healthcare) Does Patient Have a Medical Advance Directive?: No    Additional Information 1:1 In Past 12 Months?: No CIRT Risk: No Elopement Risk: No Does patient have medical clearance?: No     Disposition:  Disposition Initial Assessment Completed for this Encounter: Yes Disposition of Patient: Other dispositions  On Site Evaluation by:   Reviewed with Physician:    Justice Deeds 06/09/2017 8:57 PM

## 2017-06-09 NOTE — ED Notes (Signed)
Pt given meal tray and juice. 

## 2017-06-09 NOTE — ED Provider Notes (Signed)
[facility] Emergency Department Provider Note  ____________________________________________   First MD Initiated Contact with Patient 06/09/17 1708     (approximate)  I have reviewed the triage vital signs and the nursing notes.   HISTORY  Chief Complaint Suicidal   HPI Alex Winters is a 61 y.o. male who is presenting to the emergency department with a suicide attempt 2 over the past 3 days. He says that he is having difficulty coping with a problem with his romantic life and attempted to overdose on lisinopril this past Thursday and then Benadryl this past Friday. He says that yesterday was a completely blur for him. He also admits using cocaine this past Thursday. He is denying any pain at this time. Denies any drinking. He is requesting psychiatric evaluation for suicidal ideation.   Past Medical History:  Diagnosis Date  . Allergy    Nuts  . Hypertension     Patient Active Problem List   Diagnosis Date Noted  . Atrial flutter with rapid ventricular response (HCC) 02/06/2017  . New onset atrial flutter (HCC) 02/06/2017  . Tobacco abuse 02/06/2017  . Hyperlipidemia 12/28/2014  . Hypertension 06/30/2013    Past Surgical History:  Procedure Laterality Date  . KNEE SURGERY    . TONSILLECTOMY      Prior to Admission medications   Medication Sig Start Date End Date Taking? Authorizing Provider  amLODipine (NORVASC) 5 MG tablet Take 1 tablet (5 mg total) by mouth daily. 09/11/16   Darci CurrentBrown, [place] N, MD  apixaban (ELIQUIS) 5 MG TABS tablet Take 1 tablet (5 mg total) by mouth 2 (two) times daily. 02/07/17   Shaune Pollackhen, Qing, MD  lisinopril (PRINIVIL,ZESTRIL) 10 MG tablet Take 4 tablets (40 mg total) by mouth daily. 09/11/16   Darci CurrentBrown, [place] N, MD  metoprolol tartrate (LOPRESSOR) 25 MG tablet Take 1 tablet (25 mg total) by mouth 2 (two) times daily. 02/07/17   Shaune Pollackhen, Qing, MD  oxyCODONE-acetaminophen (ROXICET) 5-325 MG tablet Take 1 tablet by mouth every  4 (four) hours as needed for severe pain. Patient not taking: Reported on 02/06/2017 09/11/16   Darci CurrentBrown, [place] N, MD    Allergies Peanut-containing drug products  Family History  Problem Relation Age of Onset  . Heart disease Mother   . Diabetes Mother   . Hypertension Father     Social History Social History  Substance Use Topics  . Smoking status: Current Every Day Smoker    Packs/day: 0.50    Types: Cigarettes  . Smokeless tobacco: Never Used  . Alcohol use No    Review of Systems  Constitutional: No fever/chills Eyes: No visual changes. ENT: No sore throat. Cardiovascular: Denies chest pain. Respiratory: Denies shortness of breath. Gastrointestinal: No abdominal pain.  No nausea, no vomiting.  No diarrhea.  No constipation. Genitourinary: Negative for dysuria. Musculoskeletal: Negative for back pain. Skin: Negative for rash. Neurological: Negative for headaches, focal weakness or numbness.   ____________________________________________   PHYSICAL EXAM:  VITAL SIGNS: ED Triage Vitals [06/09/17 1556]  Enc Vitals Group     BP (!) 158/84     Pulse Rate 81     Resp 16     Temp      Temp src      SpO2 99 %     Weight 160 lb (72.6 kg)     Height 6' (1.829 m)     Head Circumference      Peak Flow      Pain Score  Pain Loc      Pain Edu?      Excl. in GC?     Constitutional: Alert and oriented. Well appearing and in no acute distress. Eyes: Conjunctivae are normal.  Head: Atraumatic. Nose: No congestion/rhinnorhea. Mouth/Throat: Mucous membranes are moist.  Neck: No stridor.   Cardiovascular: Normal rate, regular rhythm. Grossly normal heart sounds.   Respiratory: Normal respiratory effort.  No retractions. Lungs CTAB. Gastrointestinal: Soft and nontender. No distention. Musculoskeletal: No lower extremity tenderness nor edema.  No joint effusions. Neurologic:  Normal speech and language. No gross focal neurologic deficits are appreciated. Skin:   Skin is warm, dry and intact. No rash noted. Psychiatric: Mood and affect are normal. Speech and behavior are normal.  ____________________________________________   LABS (all labs ordered are listed, but only abnormal results are displayed)  Labs Reviewed  COMPREHENSIVE METABOLIC PANEL - Abnormal; Notable for the following:       Result Value   Sodium 134 (*)    Chloride 99 (*)    Glucose, Bld 105 (*)    BUN 22 (*)    ALT 15 (*)    All other components within normal limits  ACETAMINOPHEN LEVEL - Abnormal; Notable for the following:    Acetaminophen (Tylenol), Serum <10 (*)    All other components within normal limits  CBC - Abnormal; Notable for the following:    WBC 11.0 (*)    RBC 6.24 (*)    Hemoglobin 18.7 (*)    HCT 54.1 (*)    All other components within normal limits  ETHANOL  SALICYLATE LEVEL  URINE DRUG SCREEN, QUALITATIVE ([facility] ONLY)   ____________________________________________  EKG   ____________________________________________  RADIOLOGY   ____________________________________________   PROCEDURES  Procedure(s) performed:   Procedures  Critical Care performed:   ____________________________________________   INITIAL IMPRESSION / ASSESSMENT AND PLAN / ED COURSE  Pertinent labs & imaging results that were available during my care of the patient were reviewed by me and considered in my medical decision making (see chart for details).  Patient placed under involuntary commitment.      ____________________________________________   FINAL CLINICAL IMPRESSION(S) / ED DIAGNOSES  Suicide attempts. Intentional overdose.    NEW MEDICATIONS STARTED DURING THIS VISIT:  New Prescriptions   No medications on file     Note:  This document was prepared using Dragon voice recognition software and may include unintentional dictation errors.     Myrna Blazer, MD 06/09/17 [phone]

## 2017-06-09 NOTE — ED Notes (Signed)
Pt. To [facility] from ED ambulatory without difficulty, to room  [facility]. [facility]. Pt. Is alert and oriented, warm and dry in no distress. Pt. Denies SI, HI, and AVH. Pt. Calm and cooperative. Pt states he has relapsed on drugs and is feeling very depressed and attempted suicide x 2 this past week. Pt. Made aware of security cameras and Q15 minute rounds. Pt. Encouraged to let Nursing staff know of any concerns or needs.

## 2017-06-09 NOTE — ED Notes (Signed)
Pt only was able to urinate a few drops. Nurse went ahead and placed foley due to MD verbal order for urinary retention.

## 2017-06-09 NOTE — ED Notes (Signed)
Pt states on Friday attempted suicide twice, states in morning he took his 48m lisinopril- about 25 pills. Pt states it didn't work so he went to [employer] and got a sleep aid- benadryl 531mand took about 64 gel caps. Pt states Saturday was a "blur", admits to lying around all Saturday. Pt states he then thought about "slitting wrists or jabbing throat." pt states he became depressed and attempted suicide because of a woman. States he has given her money and things and she doesn't return the love. Pt admits to using cocaine previously and being seen [facility] for [facility]. [place] [facility] [place] he met a woman who worked at one of those facilities and they began dating. States she left his house one day for work and was killed by drunk driver. Pt states depression since then. Pt states when he was being seen [facility] he was placed on prozac which he states helped but [place] wanted to do a full assessment and examination and wouldn't refill his prescription for prozac. Pt states that medication helped him a lot. Pt denies HI, states his feelings of suicide are decreasing as he is coming off all the medications he took previously. Denies SI at current.

## 2017-06-09 NOTE — ED Provider Notes (Signed)
Patient found to be in urinary retention. Likely from Benadryl overdose. Foley placed and started on Flomax. Pending psychiatric evaluation at this time.   Alex Winters, Alex Matthew, MD 06/09/17 [id number]

## 2017-06-09 NOTE — ED Notes (Signed)
Pt ambulatory to toilet to urinate.  

## 2017-06-09 NOTE — ED Notes (Signed)
Pt was complaining about the Foley and saying that he still felt pressure. Pt said he was feeling anxious and wanted it out. Nurse made MD aware and got an order for PO ativan 1mg .

## 2017-06-09 NOTE — ED Triage Notes (Signed)
Pt here for suicide attempt X 2 this week.  Took 40 lisinipril one day thinking he would just go off to sleep and die.  Took sleeping pills another day.  Pt admits to SI today but has not attempted anything today.  Admits to HI against someone named tammy covington.  Friend with pt reports to RN that he has been smoking crack up until this week.

## 2017-06-10 DIAGNOSIS — F4325 Adjustment disorder with mixed disturbance of emotions and conduct: Secondary | ICD-10-CM

## 2017-06-10 DIAGNOSIS — F1410 Cocaine abuse, uncomplicated: Secondary | ICD-10-CM

## 2017-06-10 MED ORDER — SODIUM CHLORIDE 0.9 % IV BOLUS (SEPSIS)
1000.0000 mL | Freq: Once | INTRAVENOUS | Status: AC
  Administered 2017-06-10: 1000 mL via INTRAVENOUS

## 2017-06-10 MED ORDER — FLUOXETINE HCL 20 MG PO CAPS: 20.0000 mg | ORAL_CAPSULE | Freq: Every day | ORAL | 1 refills | Status: DC

## 2017-06-10 MED ORDER — TRAZODONE HCL 50 MG PO TABS
ORAL_TABLET | ORAL | Status: AC
  Filled 2017-06-10: qty 1

## 2017-06-10 MED ORDER — TRAZODONE HCL 50 MG PO TABS
50.0000 mg | ORAL_TABLET | Freq: Every day | ORAL | Status: DC
  Administered 2017-06-10: 50 mg via ORAL

## 2017-06-10 MED ORDER — MAGNESIUM SULFATE 2 GM/50ML IV SOLN
2.0000 g | Freq: Once | INTRAVENOUS | Status: AC
  Administered 2017-06-10: 2 g via INTRAVENOUS
  Filled 2017-06-10: qty 50

## 2017-06-10 NOTE — ED Notes (Signed)
Foley cath and iv dc'ed.   Pt waiting on ride for discharge.

## 2017-06-10 NOTE — ED Provider Notes (Signed)
-----------------------------------------   5:17 PM on 06/10/2017 -----------------------------------------   Blood pressure 110/62, pulse 68, temperature 97.6 F (36.4 C), temperature source Oral, resp. rate 18, height 1.829 m (6'), weight 72.6 kg (160 lb), SpO2 98 %.  The patient had no acute events since last update.  Calm and cooperative at this time.  I spoke in person with Dr. Toni Amendlapacs who evaluated the patient and feels he can be managed as an outpatient.  He does not meet criteria for involuntary commitment nor for inpatient treatment at this time.  Dr. Toni Amendlapacs will revoke the IVC and [facility]   Loleta RoseForbach, Angie Hogg, MD 06/10/17 1739

## 2017-06-10 NOTE — ED Notes (Signed)
Received call from ED charge. Patient is to return to ED for further treatments.

## 2017-06-10 NOTE — BH Assessment (Signed)
Writer spoke with patient to complete an updated assessment. He continues to voice SI. He states he has had multiple attempts within the last several days and continues to feel hopeless and helpless.

## 2017-06-10 NOTE — ED Notes (Signed)
Patient watching tv.

## 2017-06-10 NOTE — ED Notes (Signed)
BEHAVIORAL HEALTH ROUNDING Patient sleeping: No. Patient alert and oriented: yes Behavior appropriate: Yes.  ; If no, describe:  Nutrition and fluids offered: yes Toileting and hygiene offered: Yes  Sitter present: q15 minute observations and security  monitoring Law enforcement present: Yes  [facility]  

## 2017-06-10 NOTE — ED Notes (Signed)
D/c inst to pt.  Iv and foley cath dc'ed   Pt calm and cooperative.

## 2017-06-10 NOTE — ED Notes (Signed)
BEHAVIORAL HEALTH ROUNDING Patient sleeping: No. Patient alert and oriented: yes Behavior appropriate: Yes.  ; If no, describe:  Nutrition and fluids offered: Yes  Toileting and hygiene offered: Yes  Sitter present: q 15 min checks Law enforcement present: Yes  

## 2017-06-10 NOTE — Consult Note (Signed)
[facility] Face-to-Face Psychiatry Consult   Reason for Consult:  Consult for 61 year old man who came into the hospital complaining about his mood and recent suicidal thought. Referring Physician:  Karma Greaser Patient Identification: Alex Winters MRN:  [id number] Principal Diagnosis: Adjustment disorder with mixed disturbance of emotions and conduct Diagnosis:   Patient Active Problem List   Diagnosis Date Noted  . Cocaine abuse [F14.10] 06/10/2017  . Adjustment disorder with mixed disturbance of emotions and conduct [F43.25] 06/10/2017  . Atrial flutter with rapid ventricular response ([place]) [I48.92] 02/06/2017  . New onset atrial flutter ([place]) [I48.92] 02/06/2017  . Tobacco abuse [Z72.0] 02/06/2017  . Hyperlipidemia [E78.5] 12/28/2014  . Hypertension [I10] 06/30/2013    Total Time spent with patient: 1 hour  Subjective:   Alex Winters is a 61 y.o. male patient admitted with "I guess I reached the end of my rope".  HPI:  Patient interviewed. Chart reviewed. 61 year old man came in to the emergency room over the weekend saying that he isn't had 2 recent suicide attempts. First he tried to overdose on his lisinopril and the next day tried to overdose on Benadryl. Both of those were a couple of days ago. He said his mood is been bad recently and he blames it on crack cocaine and a woman. He says a woman that he had been involved with had recently left him which made him particularly depressed. He has a long rather dramatic story about a girlfriend he had who was killed last year and how he then became involved with the other woman on the rebound. Recently however as started using crack cocaine more regularly but says he hasn't used any drugs since Thursday. Denies using any alcohol. Patient has been to our TS in the past and has seen outpatient psychiatrist but is not currently getting any outpatient treatment. On interview today he is upbeat and completely denies any current suicidal  thoughts.  Social history: He lives in [place] by himself. Not currently working. Says he does have friends he can rely on.  Medical history: Has a history of high blood pressure also takes a blood thinner regularly. Not always completely compliant with his medicine.  Substance abuse history: History of intermittent cocaine use. He had been clean for a long period of time before recently relapsing.  Past Psychiatric History: Patient has no prior history of suicide attempts. He has seen psychiatrists in the past particularly at [facility]. He was put on Prozac but did not follow-up for very long. He says he thought that it really helped him to feel better during the time he was taking it however. No history of mania or psychotic symptoms.  Risk to Self: Suicidal Ideation: Yes-Currently Present Suicidal Intent: No-Not Currently/Within Last 6 Months Is patient at risk for suicide?: No (Currently in the hospital) Suicidal Plan?: No Access to Means: No What has been your use of drugs/alcohol within the last 12 months?: History of Crack usage How many times?: 2 Other Self Harm Risks: denied Triggers for Past Attempts: Unknown Intentional Self Injurious Behavior: None Risk to Others: Homicidal Ideation: No Thoughts of Harm to Others: No Current Homicidal Intent: No Current Homicidal Plan: No Access to Homicidal Means: No Identified Victim: None identified History of harm to others?: No Assessment of Violence: None Noted Violent Behavior Description: None reported Does patient have access to weapons?: Yes (Comment) (knives and tools - no firearms) Criminal Charges Pending?: No Does patient have a court date: No Prior Inpatient Therapy: Prior Inpatient  Therapy: No Prior Therapy Dates: n/a Prior Therapy Facilty/Provider(s): n/a Reason for Treatment: n/a Prior Outpatient Therapy: Prior Outpatient Therapy: Yes Prior Therapy Dates: 2014 and prior Prior Therapy Facilty/Provider([facility]): [facility], [facility], [facility] Reason for Treatment: Substance abuse,  Does patient have an ACCT team?: No Does patient have Intensive In-House Services?  : No Does patient have [facility] services? : No Does patient have P4CC services?: No  Past Medical History:  Past Medical History:  Diagnosis Date  . Allergy    Nuts  . Hypertension     Past Surgical History:  Procedure Laterality Date  . KNEE SURGERY    . TONSILLECTOMY     Family History:  Family History  Problem Relation Age of Onset  . Heart disease Mother   . Diabetes Mother   . Hypertension Father    Family Psychiatric  History: Denies family history Social History:  History  Alcohol Use No     History  Drug Use No    Comment: clean from Crack since 02/07/13.    Social History   Social History  . Marital status: Divorced    Spouse name: N/A  . Number of children: N/A  . Years of education: N/A   Social History Main Topics  . Smoking status: Current Every Day Smoker    Packs/day: 0.50    Types: Cigarettes  . Smokeless tobacco: Never Used  . Alcohol use No  . 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  Marland Kitchen Sexual activity: Not Asked   Other Topics Concern  . None   Social History Narrative  . None   Additional Social History:    Allergies:   Allergies  Allergen Reactions  . Peanut-Containing Drug Products     Labs:  Results for orders placed or performed during the hospital encounter of 06/09/17 (from the past 48 hour(s))  Comprehensive metabolic panel     Status: Abnormal   Collection Time: 06/09/17  4:06 PM  Result Value Ref Range   Sodium 134 (L) 135 - 145 mmol/L   Potassium 3.7 3.5 - 5.1 mmol/L   Chloride 99 (L) 101 - 111 mmol/L   CO2 24 22 - 32 mmol/L   Glucose, Bld 105 (H) 65 - 99 mg/dL   BUN 22 (H) 6 - 20 mg/dL   Creatinine, Ser 0.95 0.61 - 1.24 mg/dL   Calcium 9.1 8.9 - 10.3 mg/dL   Total Protein 7.7 6.5 - 8.1 g/dL   Albumin 4.1 3.5 - 5.0 g/dL   AST 31 15 -  41 U/L   ALT 15 (L) 17 - 63 U/L   Alkaline Phosphatase 74 38 - 126 U/L   Total Bilirubin 0.8 0.3 - 1.2 mg/dL   GFR calc non Af Amer >60 >60 mL/min   GFR calc Af Amer >60 >60 mL/min    Comment: (NOTE) The eGFR has been calculated using the CKD EPI equation. This calculation has not been validated in all clinical situations. eGFR's persistently <60 mL/min signify possible Chronic Kidney Disease.    Anion gap 11 5 - 15  Ethanol     Status: None   Collection Time: 06/09/17  4:06 PM  Result Value Ref Range   Alcohol, Ethyl (B) <5 <5 mg/dL    Comment:        LOWEST DETECTABLE LIMIT FOR SERUM ALCOHOL IS 5 mg/dL FOR MEDICAL PURPOSES ONLY   Salicylate level     Status: None  Collection Time: 06/09/17  4:06 PM  Result Value Ref Range   Salicylate Lvl <3.5 2.8 - 30.0 mg/dL  Acetaminophen level     Status: Abnormal   Collection Time: 06/09/17  4:06 PM  Result Value Ref Range   Acetaminophen (Tylenol), Serum <10 (L) 10 - 30 ug/mL    Comment:        THERAPEUTIC CONCENTRATIONS VARY SIGNIFICANTLY. A RANGE OF 10-30 ug/mL MAY BE AN EFFECTIVE CONCENTRATION FOR MANY PATIENTS. HOWEVER, SOME ARE BEST TREATED AT CONCENTRATIONS OUTSIDE THIS RANGE. ACETAMINOPHEN CONCENTRATIONS >150 ug/mL AT 4 HOURS AFTER INGESTION AND >50 ug/mL AT 12 HOURS AFTER INGESTION ARE OFTEN ASSOCIATED WITH TOXIC REACTIONS.   cbc     Status: Abnormal   Collection Time: 06/09/17  4:06 PM  Result Value Ref Range   WBC 11.0 (H) 3.8 - 10.6 K/uL   RBC 6.24 (H) 4.40 - 5.90 MIL/uL   Hemoglobin 18.7 (H) 13.0 - 18.0 g/dL   HCT 54.1 (H) 40.0 - 52.0 %   MCV 86.6 80.0 - 100.0 fL   MCH 30.0 26.0 - 34.0 pg   MCHC 34.7 32.0 - 36.0 g/dL   RDW 13.5 11.5 - 14.5 %   Platelets 177 150 - 440 K/uL  Urine Drug Screen, Qualitative     Status: Abnormal   Collection Time: 06/09/17  4:06 PM  Result Value Ref Range   Tricyclic, Ur Screen POSITIVE (A) NONE DETECTED   Amphetamines, Ur Screen NONE DETECTED NONE DETECTED   MDMA  (Ecstasy)Ur Screen NONE DETECTED NONE DETECTED   Cocaine Metabolite,Ur [place] POSITIVE (A) NONE DETECTED   Opiate, Ur Screen NONE DETECTED NONE DETECTED   Phencyclidine (PCP) Ur S NONE DETECTED NONE DETECTED   Cannabinoid 50 Ng, Ur [place] NONE DETECTED NONE DETECTED   Barbiturates, Ur Screen NONE DETECTED NONE DETECTED   Benzodiazepine, Ur Scrn NONE DETECTED NONE DETECTED   Methadone Scn, Ur NONE DETECTED NONE DETECTED    Comment: (NOTE) 329  Tricyclics, urine               Cutoff 1000 ng/mL 200  Amphetamines, urine             Cutoff 1000 ng/mL 300  MDMA (Ecstasy), urine           Cutoff 500 ng/mL 400  Cocaine Metabolite, urine       Cutoff 300 ng/mL 500  Opiate, urine                   Cutoff 300 ng/mL 600  Phencyclidine (PCP), urine      Cutoff 25 ng/mL 700  Cannabinoid, urine              Cutoff 50 ng/mL 800  Barbiturates, urine             Cutoff 200 ng/mL 900  Benzodiazepine, urine           Cutoff 200 ng/mL 1000 Methadone, urine                Cutoff 300 ng/mL 1100 1200 The urine drug screen provides only a preliminary, unconfirmed 1300 analytical test result and should not be used for non-medical 1400 purposes. Clinical consideration and professional judgment should 1500 be applied to any positive drug screen result due to possible 1600 interfering substances. A more specific alternate chemical method 1700 must be used in order to obtain a confirmed analytical result.  1800 Gas chromato graphy / mass spectrometry (GC/MS) is the preferred 1900 confirmatory method.  Urinalysis, Complete w Microscopic     Status: Abnormal   Collection Time: 06/09/17  6:08 PM  Result Value Ref Range   Color, Urine AMBER (A) YELLOW    Comment: BIOCHEMICALS MAY BE AFFECTED BY COLOR   APPearance CLEAR (A) CLEAR   Specific Gravity, Urine 1.023 1.005 - 1.030   pH 6.0 5.0 - 8.0   Glucose, UA NEGATIVE NEGATIVE mg/dL   Hgb urine dipstick NEGATIVE NEGATIVE   Bilirubin Urine NEGATIVE NEGATIVE   Ketones,  ur NEGATIVE NEGATIVE mg/dL   Protein, ur 30 (A) NEGATIVE mg/dL   Nitrite NEGATIVE NEGATIVE   Leukocytes, UA NEGATIVE NEGATIVE   RBC / HPF 0-5 0 - 5 RBC/hpf   WBC, UA 0-5 0 - 5 WBC/hpf   Bacteria, UA NONE SEEN NONE SEEN   Squamous Epithelial / LPF 0-5 (A) NONE SEEN   Mucous PRESENT     Current Facility-Administered Medications  Medication Dose Route Frequency Provider Last Rate Last Dose  . amLODipine (NORVASC) tablet 5 mg  5 mg Oral Daily Orbie Pyo, MD   5 mg at 06/10/17 1035  . apixaban (ELIQUIS) tablet 5 mg  5 mg Oral BID Orbie Pyo, MD   5 mg at 06/10/17 1035  . lisinopril (PRINIVIL,ZESTRIL) tablet 40 mg  40 mg Oral Daily Orbie Pyo, MD   40 mg at 06/10/17 1035  . metoprolol tartrate (LOPRESSOR) tablet 25 mg  25 mg Oral BID Orbie Pyo, MD   25 mg at 06/10/17 1036  . nicotine (NICODERM CQ - dosed in mg/24 hours) patch 21 mg  21 mg Transdermal Daily Orbie Pyo, MD   21 mg at 06/10/17 1035  . tamsulosin (FLOMAX) capsule 0.4 mg  0.4 mg Oral Daily Orbie Pyo, MD   0.4 mg at 06/10/17 1035  . traZODone (DESYREL) tablet 50 mg  50 mg Oral QHS Loney Hering, MD   50 mg at 06/10/17 7253   Current Outpatient Prescriptions  Medication Sig Dispense Refill  . amLODipine (NORVASC) 5 MG tablet Take 1 tablet (5 mg total) by mouth daily. 30 tablet 3  . apixaban (ELIQUIS) 5 MG TABS tablet Take 1 tablet (5 mg total) by mouth 2 (two) times daily. 60 tablet 2  . FLUoxetine (PROZAC) 20 MG capsule Take 1 capsule (20 mg total) by mouth daily. 30 capsule 1  . lisinopril (PRINIVIL,ZESTRIL) 10 MG tablet Take 4 tablets (40 mg total) by mouth daily. 30 tablet 3  . metoprolol tartrate (LOPRESSOR) 25 MG tablet Take 1 tablet (25 mg total) by mouth 2 (two) times daily. 60 tablet 2  . oxyCODONE-acetaminophen (ROXICET) 5-325 MG tablet Take 1 tablet by mouth every 4 (four) hours as needed for severe pain. (Patient not taking: Reported  on 02/06/2017) 12 tablet 0    Musculoskeletal: Strength & Muscle Tone: within normal limits Gait & Station: normal Patient leans: N/A  Psychiatric Specialty Exam: Physical Exam  Nursing note and vitals reviewed. Constitutional: He appears well-developed and well-nourished.  HENT:  Head: Normocephalic and atraumatic.  Eyes: Conjunctivae are normal. Pupils are equal, round, and reactive to light.  Neck: Normal range of motion.  Cardiovascular: Regular rhythm and normal heart sounds.   Respiratory: Effort normal. No respiratory distress.  GI: Soft.  Musculoskeletal: Normal range of motion.  Neurological: He is alert.  Skin: Skin is warm and dry.  Psychiatric: He has a normal mood and affect. His speech is normal and behavior is normal. Judgment and thought content normal.  Cognition and memory are normal.    Review of Systems  Constitutional: Negative.   HENT: Negative.   Eyes: Negative.   Respiratory: Negative.   Cardiovascular: Negative.   Gastrointestinal: Negative.   Musculoskeletal: Negative.   Skin: Negative.   Neurological: Negative.   Psychiatric/Behavioral: Positive for substance abuse. Negative for depression, hallucinations and suicidal ideas. The patient is not nervous/anxious and does not have insomnia.     Blood pressure 110/62, pulse 68, temperature 97.6 F (36.4 C), temperature source Oral, resp. rate 18, height 6' (1.829 m), weight 72.6 kg (160 lb), SpO2 98 %.Body mass index is 21.7 kg/m.  General Appearance: Disheveled  Eye Contact:  Fair  Speech:  Clear and Coherent  Volume:  Normal  Mood:  Euthymic  Affect:  Appropriate  Thought Process:  Goal Directed  Orientation:  Full (Time, Place, and Person)  Thought Content:  Logical  Suicidal Thoughts:  No  Homicidal Thoughts:  No  Memory:  Immediate;   Fair Recent;   Fair Remote;   Fair  Judgement:  Fair  Insight:  Fair  Psychomotor Activity:  Normal  Concentration:  Concentration: Fair  Recall:  [employer] of Knowledge:  Fair  Language:  Fair  Akathisia:  No  Handed:  Right  AIMS (if indicated):     Assets:  Desire for Improvement Housing Resilience Social Support  ADL's:  Intact  Cognition:  WNL  Sleep:        Treatment Plan Summary: Medication management and Plan 61 year old man has been in the emergency room for a while after coming in concerned about suicidal ideation. He says that having rested and calm down and he feels that his mood is much better. Now completely denies any suicidal ideation. Reports that he feels much more upbeat and confident. He presents with a rather histrionic story. Has plenty of positive plans for the future. Does not appear to have any high risk of trying to harm himself right now. Patient was requesting that I restart him on Prozac 20 mg a day. Side effects reviewed. Prescription done. He is to follow up with [facility] cause he lives in [place] and with substance abuse counseling in the community.  Disposition: Patient does not meet criteria for psychiatric inpatient admission. Supportive therapy provided about ongoing stressors.  Alethia Berthold, MD 06/10/2017 8:38 PM

## 2017-06-10 NOTE — ED Notes (Signed)
Discussed plan of care with Dr. Lamont Snowballifenbark.  Dr. Lamont Snowballifenbark states patient is medically cleared except for urinary issues, so should keep his foley catheter for now but does not need cardiac monitoring.  MD states sitter can be discontinued at this time.

## 2017-06-10 NOTE — ED Notes (Signed)
BEHAVIORAL HEALTH ROUNDING Patient sleeping: Yes.   Patient alert and oriented: sleeping Behavior appropriate: Yes.  ; If no, describe:  Nutrition and fluids offered: Yes  Toileting and hygiene offered: Yes  Sitter present: q 15 min checks Law enforcement present: Yes  

## 2017-06-10 NOTE — ED Notes (Signed)
resumed care from Chancelor Hardrick t rn.  Pt alert.  Watching tv.  Pt calm and cooperative.

## 2017-06-10 NOTE — ED Notes (Signed)
Water with extra ice provided   No other verbalized needs or concerns at this time

## 2017-06-10 NOTE — ED Notes (Signed)
Pt ate dinner tray.

## 2017-06-10 NOTE — ED Notes (Signed)
Patient in sleeping at this time

## 2017-06-10 NOTE — ED Notes (Signed)
Patient returned to unit with a sitter and foley cath. Patient denies SI/HI/AVH. Patient states he feels better since having foley but its a little uncomfortable. Patient is currently requesting something to help him sleep.

## 2017-06-10 NOTE — ED Notes (Signed)
ED  Is the patient under IVC or is there intent for IVC: Yes.   Is the patient medically cleared: Yes.   Is there vacancy in the ED BHU: Yes.   Is the population mix appropriate for patient: Yes.   Is the patient awaiting placement in inpatient or outpatient setting:  Has the patient had a psychiatric consult:  Consult pending  Survey of unit performed for contraband, proper placement and condition of furniture, tampering with fixtures in bathroom, shower, and each patient room: Yes.  ; Findings:  APPEARANCE/BEHAVIOR Calm and cooperative NEURO ASSESSMENT Orientation: oriented x3  Denies pain Hallucinations: No.None noted (Hallucinations) Speech: Normal Gait: normal RESPIRATORY ASSESSMENT Even  Unlabored respirations  CARDIOVASCULAR ASSESSMENT Pulses equal   regular rate  Skin warm and dry   GASTROINTESTINAL ASSESSMENT no GI complaint EXTREMITIES Full ROM  PLAN OF CARE Provide calm/safe environment. Vital signs assessed twice daily. ED BHU Assessment once each 12-hour shift. Collaborate with TTS daily or as condition indicates. Assure the ED provider has rounded once each shift. Provide and encourage hygiene. Provide redirection as needed. Assess for escalating behavior; address immediately and inform ED provider.  Assess family dynamic and appropriateness for visitation as needed: Yes.  ; If necessary, describe findings:  Educate the patient/family about BHU procedures/visitation: Yes.  ; If necessary, describe findings:   

## 2017-06-10 NOTE — ED Notes (Signed)
Pt made aware of transfer to main ED and is in agreement.

## 2017-06-10 NOTE — ED Notes (Signed)
Pt's BP was 89/64. Got verbal order from MD for 1 L NS and hung for BP.

## 2017-06-10 NOTE — Discharge Instructions (Signed)

## 2017-06-10 NOTE — ED Notes (Signed)
Iv discontinued and foley cath taken out

## 2017-06-10 NOTE — ED Provider Notes (Signed)
[facility]. I initially order trazodone which is one of the patient's medications. Pharmacy called stating that his old EKG showed a prolonged QTC. I repeated the patient's EKG and it showed that he was in bigeminy and frequent PVCs. His QTC was also 521. I decided to give the patient some magnesium sulfate and a liter of normal saline.  ED ECG REPORT I, Rebecka ApleyWebster,  Amor Packard P, the attending physician, personally viewed and interpreted this ECG.   Date: 06/10/2017  EKG Time: 109  Rate: 68  Rhythm: normal sinus rhythm, frequent PVC's noted, bigeminy  Axis: normal  Intervals:none  ST&T Change: none  The patient is awaiting evaluation by psych.   Rebecka ApleyWebster, Kayleann Mccaffery P, MD 06/10/17 [phone]

## 2017-06-10 NOTE — ED Notes (Signed)
Patient in room watchng tv, nurse Tobi Bastosnna in room

## 2017-06-12 ENCOUNTER — Encounter: Payer: Self-pay | Admitting: *Deleted

## 2017-06-12 ENCOUNTER — Emergency Department
Admission: EM | Admit: 2017-06-12 | Discharge: 2017-06-12 | Disposition: A | Discharge status: Home / Self Care | Payer: Self-pay | Attending: Emergency Medicine | Admitting: Emergency Medicine

## 2017-06-12 DIAGNOSIS — Z91010 Allergy to peanuts: Secondary | ICD-10-CM | POA: Insufficient documentation

## 2017-06-12 DIAGNOSIS — I10 Essential (primary) hypertension: Secondary | ICD-10-CM | POA: Insufficient documentation

## 2017-06-12 DIAGNOSIS — R339 Retention of urine, unspecified: Secondary | ICD-10-CM | POA: Insufficient documentation

## 2017-06-12 DIAGNOSIS — F17210 Nicotine dependence, cigarettes, uncomplicated: Secondary | ICD-10-CM | POA: Insufficient documentation

## 2017-06-12 LAB — URINALYSIS, COMPLETE (UACMP) WITH MICROSCOPIC
BILIRUBIN URINE: NEGATIVE
Bacteria, UA: NONE SEEN
Glucose, UA: NEGATIVE mg/dL
Hgb urine dipstick: NEGATIVE
Ketones, ur: NEGATIVE mg/dL
Leukocytes, UA: NEGATIVE
NITRITE: NEGATIVE
PH: 7 (ref 5.0–8.0)
Protein, ur: NEGATIVE mg/dL
SPECIFIC GRAVITY, URINE: 1.009 (ref 1.005–1.030)
Squamous Epithelial / LPF: NONE SEEN

## 2017-06-12 LAB — CBC WITH DIFFERENTIAL/PLATELET
Basophils Absolute: 0.1 10*3/uL (ref 0–0.1)
Basophils Relative: 1 %
EOS ABS: 0.1 10*3/uL (ref 0–0.7)
EOS PCT: 1 %
HCT: 50.5 % (ref 40.0–52.0)
HEMOGLOBIN: 17.3 g/dL (ref 13.0–18.0)
LYMPHS ABS: 1.4 10*3/uL (ref 1.0–3.6)
Lymphocytes Relative: 18 %
MCH: 30.1 pg (ref 26.0–34.0)
MCHC: 34.3 g/dL (ref 32.0–36.0)
MCV: 87.6 fL (ref 80.0–100.0)
MONOS PCT: 10 %
Monocytes Absolute: 0.8 10*3/uL (ref 0.2–1.0)
NEUTROS PCT: 70 %
Neutro Abs: 5.4 10*3/uL (ref 1.4–6.5)
Platelets: 161 10*3/uL (ref 150–440)
RBC: 5.76 MIL/uL (ref 4.40–5.90)
RDW: 13.2 % (ref 11.5–14.5)
WBC: 7.8 10*3/uL (ref 3.8–10.6)

## 2017-06-12 LAB — BASIC METABOLIC PANEL
Anion gap: 7 (ref 5–15)
BUN: 10 mg/dL (ref 6–20)
CHLORIDE: 104 mmol/L (ref 101–111)
CO2: 26 mmol/L (ref 22–32)
CREATININE: 0.48 mg/dL — AB (ref 0.61–1.24)
Calcium: 9 mg/dL (ref 8.9–10.3)
GFR calc Af Amer: >60 mL/min (ref >60)
GFR calc non Af Amer: >60 mL/min (ref >60)
Glucose, Bld: 95 mg/dL (ref 65–99)
Potassium: 3.9 mmol/L (ref 3.5–5.1)
Sodium: 137 mmol/L (ref 135–145)

## 2017-06-12 MED ORDER — TAMSULOSIN HCL 0.4 MG PO CAPS: 0.4000 mg | ORAL_CAPSULE | Freq: Every day | ORAL | 0 refills | Status: DC

## 2017-06-12 MED ORDER — LIDOCAINE HCL 2 % EX GEL
CUTANEOUS | Status: AC
  Filled 2017-06-12: qty 10

## 2017-06-12 MED ORDER — LIDOCAINE HCL 2 % EX GEL: 1.0000 "application " | Freq: Once | CUTANEOUS | Status: DC

## 2017-06-12 MED ORDER — TAMSULOSIN HCL 0.4 MG PO CAPS
0.4000 mg | ORAL_CAPSULE | Freq: Once | ORAL | Status: AC
  Administered 2017-06-12: 0.4 mg via ORAL
  Filled 2017-06-12: qty 1

## 2017-06-12 NOTE — ED Triage Notes (Signed)
Pt arrives with complaints of urinary rentention, states Sunday he was seen in ED for SI attempt but states he also had urinary retention and had a foley placed, states continued difficulty urinating, states he had "dribbles" this AM, awake and alert in no acute distress

## 2017-06-12 NOTE — ED Notes (Signed)
Pt discharged home after verbalizing understanding of discharge instructions; nad noted. 

## 2017-06-12 NOTE — Discharge Instructions (Signed)

## 2017-06-12 NOTE — ED Notes (Signed)
Pt presents with urinary retention since he left hospital on Sunday. He was brought in for OD and c/o urinary retention, foley paced Sunday night. He was discharged 10PM Monday night and foley was removed. He has not been able to urinate since. He does state he has had "dribble" and "pressure relief" since then but no real urine stream. Pt has >999 ml per bladder scan.

## 2017-06-12 NOTE — ED Provider Notes (Signed)
[facility] Emergency Department Provider Note  ____________________________________________  Time seen: Approximately 11:11 AM  I have reviewed the triage vital signs and the nursing notes.   HISTORY  Chief Complaint Urinary Retention   HPI Alex Winters is a 61 y.o. male who developed urinary retention after Benadryl overdose who presents for urinary retention. Patient had a Foley catheter placed on 6/10 for urinary retention.Before being discharged from the hospital on 611 patient's Foley catheter was discontinued. Since going home patient has had some urinary dribbling but hasn't been urinating normal. Today he called the hospital and was instructed to come back for further evaluation. He tells me he was not discharged on Flomax therefore has not been taking it. No prior history of known BPH or prior episodes of urinary retention. Patient denies abdominal pain and reports a pressure sensation located in his suprapubic region that has been constant since this morning, mild and nonradiating. No nausea or vomiting, no dysuria, no fever or chills.  Past Medical History:  Diagnosis Date  . Allergy    Nuts  . Hypertension     Patient Active Problem List   Diagnosis Date Noted  . Cocaine abuse 06/10/2017  . 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 Atrial flutter with rapid ventricular response (HCC) 02/06/2017  . New onset atrial flutter (HCC) 02/06/2017  . Tobacco abuse 02/06/2017  . Hyperlipidemia 12/28/2014  . Hypertension 06/30/2013    Past Surgical History:  Procedure Laterality Date  . KNEE SURGERY    . TONSILLECTOMY      Prior to Admission medications   Medication Sig Start Date End Date Taking? Authorizing Provider  FLUoxetine (PROZAC) 20 MG capsule Take 1 capsule (20 mg total) by mouth daily. 06/10/17 06/10/18 Yes Clapacs, Jackquline Denmark, MD  amLODipine (NORVASC) 5 MG tablet Take 1 tablet (5 mg total) by  mouth daily. Patient not taking: Reported on 06/12/2017 09/11/16   Darci Current, MD  apixaban (ELIQUIS) 5 MG TABS tablet Take 1 tablet (5 mg total) by mouth 2 (two) times daily. Patient not taking: Reported on 06/12/2017 02/07/17   Shaune Pollack, MD  lisinopril (PRINIVIL,ZESTRIL) 10 MG tablet Take 4 tablets (40 mg total) by mouth daily. Patient not taking: Reported on 06/12/2017 09/11/16   Darci Current, MD  metoprolol tartrate (LOPRESSOR) 25 MG tablet Take 1 tablet (25 mg total) by mouth 2 (two) times daily. Patient not taking: Reported on 06/12/2017 02/07/17   Shaune Pollack, MD  oxyCODONE-acetaminophen (ROXICET) 5-325 MG tablet Take 1 tablet by mouth every 4 (four) hours as needed for severe pain. Patient not taking: Reported on 02/06/2017 09/11/16   Darci Current, MD  tamsulosin (FLOMAX) 0.4 MG CAPS capsule Take 1 capsule (0.4 mg total) by mouth daily. 06/12/17   Nita Sickle, MD    Allergies Peanut-containing drug products  Family History  Problem Relation Age of Onset  . Heart disease Mother   . Diabetes Mother   . Hypertension Father     Social History Social History  Substance Use Topics  . Smoking status: Current Every Day Smoker    Packs/day: 0.50    Types: Cigarettes  . Smokeless tobacco: Never Used  . Alcohol use No    Review of Systems  Constitutional: Negative for fever. Eyes: Negative for visual changes. ENT: Negative for sore throat. Neck: No neck pain  Cardiovascular: Negative for chest pain. Respiratory: Negative for shortness of breath. Gastrointestinal: Negative for abdominal pain, vomiting  or diarrhea. Genitourinary: Negative for dysuria. + urinary retention Musculoskeletal: Negative for back pain. Skin: Negative for rash. Neurological: Negative for headaches, weakness or numbness. Psych: No SI or HI  ____________________________________________   PHYSICAL EXAM:  VITAL SIGNS: ED Triage Vitals  Enc Vitals Group     BP 06/12/17 1029 130/89      Pulse Rate 06/12/17 1029 85     Resp 06/12/17 1029 18     Temp 06/12/17 1029 98.3 F (36.8 C)     Temp Source 06/12/17 1029 Oral     SpO2 06/12/17 1029 99 %     Weight 06/12/17 1030 16 lb (7.258 kg)     Height 06/12/17 1030 6' (1.829 m)     Head Circumference --      Peak Flow --      Pain Score 06/12/17 1029 3     Pain Loc --      Pain Edu? --      Excl. in GC? --     Constitutional: Alert and oriented. Well appearing and in no apparent distress. HEENT:      Head: Normocephalic and atraumatic.         Eyes: Conjunctivae are normal. Sclera is non-icteric.       Mouth/Throat: Mucous membranes are moist.       Neck: Supple with no signs of meningismus. Cardiovascular: Regular rate and rhythm. No murmurs, gallops, or rubs. 2+ symmetrical distal pulses are present in all extremities. No JVD. Respiratory: Normal respiratory effort. Lungs are clear to auscultation bilaterally. No wheezes, crackles, or rhonchi.  Gastrointestinal: Soft, bladder palpable on the suprapubic region, non distended with positive bowel sounds. No rebound or guarding. Genitourinary: No CVA tenderness. Musculoskeletal: Nontender with normal range of motion in all extremities. No edema, cyanosis, or erythema of extremities. Neurologic: Normal speech and language. Face is symmetric. Moving all extremities. No gross focal neurologic deficits are appreciated. Skin: Skin is warm, dry and intact. No rash noted. Psychiatric: Mood and affect are normal. Speech and behavior are normal.  ____________________________________________   LABS (all labs ordered are listed, but only abnormal results are displayed)  Labs Reviewed  URINALYSIS, COMPLETE (UACMP) WITH MICROSCOPIC - Abnormal; Notable for the following:       Result Value   Color, Urine YELLOW (*)    APPearance CLEAR (*)    All other components within normal limits  BASIC METABOLIC PANEL - Abnormal; Notable for the following:    Creatinine, Ser 0.48 (*)    All  other components within normal limits  URINE CULTURE  CBC WITH DIFFERENTIAL/PLATELET   ____________________________________________  EKG  none  ____________________________________________  RADIOLOGY  none  ____________________________________________   PROCEDURES  Procedure(s) performed: None Procedures Critical Care performed:  None ____________________________________________   INITIAL IMPRESSION / ASSESSMENT AND PLAN / ED COURSE   61 y.o. male who developed urinary retention after Benadryl overdose who presents for urinary retention. Patient is in no distress and well-appearing, his vitals are within normal limits. He has greater than 999 cc in his bladder. We'll replace Foley catheter, start patient on Flomax, check a BMP to make sure his kidney function is normal and send urine for culture. His blood work is within normal limits plan for discharge with close follow-up with urology for a trial of void.    _________________________ 12:06 PM on 06/12/2017 -----------------------------------------  Patient with normal creatinine, normal white count, unclear UA with no evidence of acute kidney injury or infection. Patient is being discharged home  with the Foley catheter. Large bag for overnight use and leg bag have been provided. I discussed instructions of Foley catheter care with patient. Recommended follow-up with urology in a week for trial of void. Patient will be given a prescription for Flomax.  Pertinent labs & imaging results that were available during my care of the patient were reviewed by me and considered in my medical decision making (see chart for details).    ____________________________________________   FINAL CLINICAL IMPRESSION(S) / ED DIAGNOSES  Final diagnoses:  Urinary retention      NEW MEDICATIONS STARTED DURING THIS VISIT:  New Prescriptions   TAMSULOSIN (FLOMAX) 0.4 MG CAPS CAPSULE    Take 1 capsule (0.4 mg total) by mouth daily.      Note:  This document was prepared using Dragon voice recognition software and may include unintentional dictation errors.    Nita Sickle, MD 06/12/17 [phone]

## 2017-06-13 LAB — URINE CULTURE: Culture: NO GROWTH

## 2017-06-20 ENCOUNTER — Encounter: Payer: Self-pay | Admitting: Urology

## 2017-06-20 ENCOUNTER — Ambulatory Visit (INDEPENDENT_AMBULATORY_CARE_PROVIDER_SITE_OTHER): Payer: Self-pay | Admitting: Urology

## 2017-06-20 VITALS — BP 159/79 | HR 69 | Ht 72.0 in | Wt 177.7 lb

## 2017-06-20 DIAGNOSIS — R339 Retention of urine, unspecified: Secondary | ICD-10-CM

## 2017-06-20 DIAGNOSIS — R338 Other retention of urine: Secondary | ICD-10-CM

## 2017-06-20 DIAGNOSIS — N401 Enlarged prostate with lower urinary tract symptoms: Secondary | ICD-10-CM

## 2017-06-20 NOTE — Progress Notes (Signed)
Fill and Pull Catheter Removal  Patient is present today for a catheter removal.  Patient was cleaned and prepped in a sterile fashion 300ml of sterile water/ saline was instilled into the bladder when the patient felt the urge to urinate. 10ml of water was then drained from the balloon.  A 16FR foley cath was removed from the bladder no complications were noted .  Patient as then given some time to void on their own.  Patient can void  300ml on their own after some time.  Patient tolerated well.  Preformed by: Teressa Lowerarrie Carols Clemence, CMA  Follow up/ Additional notes: 1 month

## 2017-06-20 NOTE — Progress Notes (Signed)
06/20/2017 8:33 AM   Alex Winters October 10, 1956 [id number]  Referring provider: No referring provider defined for this encounter.  Chief Complaint  Patient presents with  . Urinary Retention    HPI: The patient is a 61 year old gentleman with a past medical history of greater than 1 L urinary retention after overdoing on Benadryl presents today for trial of void after Foley catheter was placed in the emergency department. He was started on Flomax a few days ago. He reports that prior to this episode he had no problems with urination. He has nocturia 1. He had a good stream fell he emptied his bladder. He is not straining. He had no urgency.   PMH: Past Medical History:  Diagnosis Date  . Allergy    Nuts  . Hypertension     Surgical History: Past Surgical History:  Procedure Laterality Date  . KNEE SURGERY    . TONSILLECTOMY      Home Medications:  Allergies as of 06/20/2017      Reactions   Peanut-containing Drug Products       Medication List       Accurate as of 06/20/17  8:33 AM. Always use your most recent med list.          amLODipine 5 MG tablet Commonly known as:  NORVASC Take 1 tablet (5 mg total) by mouth daily.   apixaban 5 MG Tabs tablet Commonly known as:  ELIQUIS Take 1 tablet (5 mg total) by mouth 2 (two) times daily.   FLUoxetine 20 MG capsule Commonly known as:  PROZAC Take 1 capsule (20 mg total) by mouth daily.   lisinopril 10 MG tablet Commonly known as:  PRINIVIL,ZESTRIL Take 4 tablets (40 mg total) by mouth daily.   metoprolol tartrate 25 MG tablet Commonly known as:  LOPRESSOR Take 1 tablet (25 mg total) by mouth 2 (two) times daily.   oxyCODONE-acetaminophen 5-325 MG tablet Commonly known as:  ROXICET Take 1 tablet by mouth every 4 (four) hours as needed for severe pain.   tamsulosin 0.4 MG Caps capsule Commonly known as:  FLOMAX Take 1 capsule (0.4 mg total) by mouth daily.       Allergies:  Allergies  Allergen  Reactions  . Peanut-Containing Drug Products     Family History: Family History  Problem Relation Age of Onset  . Heart disease Mother   . Diabetes Mother   . Hypertension Father   . Prostate cancer Neg Hx   . Bladder Cancer Neg Hx   . Kidney cancer Neg Hx     Social History:  reports that he has been smoking Cigarettes.  He has been smoking about 0.50 packs per day. He has never used smokeless tobacco. He reports that he does not drink alcohol or use drugs.  ROS: UROLOGY Frequent Urination?: No Hard to postpone urination?: No Burning/pain with urination?: No Get up at night to urinate?: No Leakage of urine?: No Urine stream starts and stops?: No Trouble starting stream?: Yes Do you have to strain to urinate?: No Blood in urine?: No Urinary tract infection?: No Sexually transmitted disease?: No Injury to kidneys or bladder?: No Painful intercourse?: No Weak stream?: No Erection problems?: No Penile pain?: No  Gastrointestinal Nausea?: No Vomiting?: No Indigestion/heartburn?: No Diarrhea?: No Constipation?: No  Constitutional Fever: No Night sweats?: No Weight loss?: No Fatigue?: No  Skin Skin rash/lesions?: No Itching?: No  Eyes Blurred vision?: No Double vision?: No  Ears/Nose/Throat Sore throat?: No Sinus problems?: No  Hematologic/Lymphatic Swollen glands?: No Easy bruising?: Yes  Cardiovascular Leg swelling?: No Chest pain?: No  Respiratory Cough?: No Shortness of breath?: No  Endocrine Excessive thirst?: No  Musculoskeletal Back pain?: No Joint pain?: No  Neurological Headaches?: No Dizziness?: No  Psychologic Depression?: Yes Anxiety?: No  Physical Exam: BP (!) 159/79 (BP Location: Left Arm, Patient Position: Sitting, Cuff Size: Normal)   Pulse 69   Ht 6' (1.829 m)   Wt 177 lb 11.2 oz (80.6 kg)   BMI 24.10 kg/m   Constitutional:  Alert and oriented, No acute distress. HEENT: Crane AT, moist mucus membranes.  Trachea  midline, no masses. Cardiovascular: No clubbing, cyanosis, or edema. Respiratory: Normal respiratory effort, no increased work of breathing. GI: Abdomen is soft, nontender, nondistended, no abdominal masses GU: No CVA tenderness. Foley in place with clear yellow urine. Normal phallus. Skin: No rashes, bruises or suspicious lesions. Lymph: No cervical or inguinal adenopathy. Neurologic: Grossly intact, no focal deficits, moving all 4 extremities. Psychiatric: Normal mood and affect.  Laboratory Data: Lab Results  Component Value Date   WBC 7.8 06/12/2017   HGB 17.3 06/12/2017   HCT 50.5 06/12/2017   MCV 87.6 06/12/2017   PLT 161 06/12/2017    Lab Results  Component Value Date   CREATININE 0.48 (L) 06/12/2017    No results found for: PSA  No results found for: TESTOSTERONE  No results found for: HGBA1C  Urinalysis    Component Value Date/Time   COLORURINE YELLOW (A) 06/12/2017 1054   APPEARANCEUR CLEAR (A) 06/12/2017 1054   LABSPEC 1.009 06/12/2017 1054   PHURINE 7.0 06/12/2017 1054   GLUCOSEU NEGATIVE 06/12/2017 1054   HGBUR NEGATIVE 06/12/2017 1054   BILIRUBINUR NEGATIVE 06/12/2017 1054   KETONESUR NEGATIVE 06/12/2017 1054   PROTEINUR NEGATIVE 06/12/2017 1054   UROBILINOGEN 1.0 04/20/2013 1609   NITRITE NEGATIVE 06/12/2017 1054   LEUKOCYTESUR NEGATIVE 06/12/2017 1054    Assessment & Plan:    1. BPH 2. Urinary retention The patient passed his trial of void today. He was instructed to return to the office this afternoon if he is unable to void. He will continue his Flomax. He'll follow-up in one month for a symptom check and PVR.   Return in about 4 weeks (around 07/18/2017) for PVR, symptom check.  Hildred LaserBrian James Janelle Spellman, MD  [facility] [address], [address] [place], [place] [address] [phone]

## 2017-07-18 ENCOUNTER — Ambulatory Visit: Payer: Self-pay

## 2018-02-04 ENCOUNTER — Ambulatory Visit: Payer: Self-pay

## 2018-02-14 ENCOUNTER — Telehealth: Payer: Self-pay

## 2018-02-14 NOTE — Telephone Encounter (Signed)
Lm on vm that we need proof of income and tax return to be able to make an appt for pt.

## 2018-03-27 ENCOUNTER — Encounter: Payer: Self-pay | Admitting: Family Medicine

## 2018-03-27 ENCOUNTER — Ambulatory Visit: Payer: Medicaid Other | Admitting: Family Medicine

## 2018-03-27 VITALS — BP 190/106 | Temp 97.8°F | Wt 200.3 lb

## 2018-03-27 DIAGNOSIS — E785 Hyperlipidemia, unspecified: Secondary | ICD-10-CM

## 2018-03-27 DIAGNOSIS — Z125 Encounter for screening for malignant neoplasm of prostate: Secondary | ICD-10-CM

## 2018-03-27 DIAGNOSIS — I10 Essential (primary) hypertension: Secondary | ICD-10-CM

## 2018-03-27 DIAGNOSIS — R454 Irritability and anger: Secondary | ICD-10-CM | POA: Insufficient documentation

## 2018-03-27 DIAGNOSIS — I499 Cardiac arrhythmia, unspecified: Secondary | ICD-10-CM

## 2018-03-27 MED ORDER — FLUOXETINE HCL 20 MG PO CAPS: 20.0000 mg | ORAL_CAPSULE | Freq: Every day | ORAL | 1 refills | Status: AC

## 2018-03-27 MED ORDER — METOPROLOL TARTRATE 25 MG PO TABS: 25.0000 mg | ORAL_TABLET | Freq: Two times a day (BID) | ORAL | 2 refills | Status: DC

## 2018-03-27 MED ORDER — LISINOPRIL 20 MG PO TABS: 20.0000 mg | ORAL_TABLET | Freq: Every day | ORAL | 3 refills | Status: DC

## 2018-03-27 NOTE — Assessment & Plan Note (Signed)
Not under good control. Will get restarted on medication, but with lower dose to avoid dropping him too low too fast. Will restart 20mg  lisinopril and 25mg  metprolol BID. Checking labs today. Recheck next week.

## 2018-03-27 NOTE — Progress Notes (Signed)
BP (!) 190/106 (BP Location: Left Arm, Patient Position: Sitting, Cuff Size: Normal)   Temp 97.8 F (36.6 C)   Wt 200 lb 4.8 oz (90.9 kg)   BMI 27.17 kg/m    Subjective:    Patient ID: Alex Winters, male    DOB: January 06, 1956, 62 y.o.   MRN: [id number]  HPI: Alex Winters is a 62 y.o. male who presents today to establish care. He has not seen a primary care physician in many years (5-6 years ago).   Chief Complaint  Patient presents with  . Estabilsh care    Needs med refill   Has been clean since January 5. Has been having some social issues.   ATRIAL FIBRILLATION- diagnosed in February of 2018 with atrial flutter with a rapid ventricular response. Started on metoprolol and eliquis in the hospital and discharged- has not been on medicine since then due to cost. Was supposed to follow up with cardiology. Never did. He states that he was never on the eliquis. Thinks that this was due to cocaine. Atrial fibrillation status: Denies any issues with it since his trip to the hospital Satisfied with current treatment: yes  Medication side effects:  Not on anything Palpitations:  no Chest pain:  no Dyspnea on exertion:  yes Orthopnea:  yes Syncope:  no Edema:  no Ventricular rate control: metoprolol in the past, has been off medicine in about a year Anti-coagulation: Not on anything  Has had issues with urinary retention. Initially thought to be due to a benadryl overdose in June 2018- continued several days later. Started on flomax and had a foley placed in the ER- removed at urology on 06/20/17 was supposed to follow up in July, did not follow up. Feels like he fully emptying bladder. Good stream.   HYPERTENSION / HYPERLIPIDEMIA- has had since about 2003/2004 Satisfied with current treatment? Not on anything Duration of hypertension: chronic BP monitoring frequency: not checking BP medication side effects: not on anything Past BP meds: lisinopril, metoprolol Duration of  hyperlipidemia: chronic Cholesterol medication side effects: not on anything Cholesterol supplements: none Past cholesterol medications: none Medication compliance: poor compliance Aspirin: no Recent stressors: yes Recurrent headaches: no Visual changes: yes- blurred vision Palpitations: no Dyspnea: yes Chest pain: no Lower extremity edema: no Dizzy/lightheaded: no  DEPRESSION- felt like he needs the prozac. Was at [facility] in the past and had issues with Dr. Bard Herbert there. Her notes that he was started on the medicine due to "attitude issues". He notes that he had to go through anger management. He notes that he has been off of this medicine for about 5 years. He notes that he is primarily testy. He notes that he is upset about people not having manners.  Mood status: exacerbated Satisfied with current treatment?: no Symptom severity: moderate  Duration of current treatment : Has been off medicine for at least 5 years Side effects: no Medication compliance: poor compliance Psychotherapy/counseling: yes in the past Previous psychiatric medications: prozac, lexapro (didn't do anything) Depressed mood: a lot better than he was Anxious mood: no Anhedonia: no Significant weight loss or gain: no Insomnia: yes hard to stay asleep- but sleeping on a couch Fatigue: no Feelings of worthlessness or guilt: no Impaired concentration/indecisiveness: no Suicidal ideations: no Hopelessness: no Crying spells: no   Active Ambulatory Problems    Diagnosis Date Noted  . Hypertension 06/30/2013  . Hyperlipidemia 12/28/2014  . Atrial flutter with rapid ventricular response (HCC) 02/06/2017  . Tobacco abuse  02/06/2017  . Cocaine abuse (HCC) 06/10/2017  . 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 Irritability 03/27/2018   Resolved Ambulatory Problems    Diagnosis Date Noted  . New onset atrial flutter (HCC) 02/06/2017   Past Medical History:  Diagnosis  Date  . Allergy   . Hypertension   . New onset atrial flutter (HCC) 02/06/2017   Past Surgical History:  Procedure Laterality Date  . KNEE SURGERY    . TONSILLECTOMY     Outpatient Encounter Medications as of 03/27/2018  Medication Sig Note  . FLUoxetine (PROZAC) 20 MG capsule Take 1 capsule (20 mg total) by mouth daily.   Marland Kitchen lisinopril (PRINIVIL,ZESTRIL) 20 MG tablet Take 1 tablet (20 mg total) by mouth daily.   . metoprolol tartrate (LOPRESSOR) 25 MG tablet Take 1 tablet (25 mg total) by mouth 2 (two) times daily.   . [DISCONTINUED] amLODipine (NORVASC) 5 MG tablet Take 1 tablet (5 mg total) by mouth daily.   . [DISCONTINUED] FLUoxetine (PROZAC) 20 MG capsule Take 1 capsule (20 mg total) by mouth daily. 06/12/2017: Refill ready, has not picked up.  . [DISCONTINUED] lisinopril (PRINIVIL,ZESTRIL) 10 MG tablet Take 4 tablets (40 mg total) by mouth daily.   . [DISCONTINUED] metoprolol tartrate (LOPRESSOR) 25 MG tablet Take 1 tablet (25 mg total) by mouth 2 (two) times daily.   . [DISCONTINUED] apixaban (ELIQUIS) 5 MG TABS tablet Take 1 tablet (5 mg total) by mouth 2 (two) times daily. (Patient not taking: Reported on 06/12/2017)   . [DISCONTINUED] oxyCODONE-acetaminophen (ROXICET) 5-325 MG tablet Take 1 tablet by mouth every 4 (four) hours as needed for severe pain. (Patient not taking: Reported on 02/06/2017)   . [DISCONTINUED] tamsulosin (FLOMAX) 0.4 MG CAPS capsule Take 1 capsule (0.4 mg total) by mouth daily. (Patient not taking: Reported on 03/27/2018)    No facility-administered encounter medications on file as of 03/27/2018.    Allergies  Allergen Reactions  . Peanut-Containing Drug Products Other (See Comments)    Abdominal discomfort  . Statins Other (See Comments)    Myalgias, Zocor, lipitor   Social History   Socioeconomic History  . Marital status: Divorced    Spouse name: Not on file  . Number of children: Not on file  . Years of education: Not on file  . Highest education  level: Not on file  Occupational History  . Not on file  Social Needs  . Financial resource strain: Not on file  . Food insecurity:    Worry: Not on file    Inability: Not on file  . Transportation needs:    Medical: Not on file    Non-medical: Not on file  Tobacco Use  . Smoking status: Current Every Day Smoker    Packs/day: 0.50    Types: Cigarettes  . Smokeless tobacco: Never Used  Substance and Sexual Activity  . Alcohol use: No  . Drug use: No    Comment: clean from [employer] since 02/07/13.  Marland Kitchen Sexual activity: Not on file  Lifestyle  . Physical activity:    Days per week: Not on file    Minutes per session: Not on file  . Stress: Not on file  Relationships  . Social connections:    Talks on phone: Not on file    Gets together: Not on file    Attends religious service: Not on file    Active member of club or organization: Not on file    Attends meetings of  clubs or organizations: Not on file    Relationship status: Not on file  . Intimate partner violence:    Fear of current or ex partner: Not on file    Emotionally abused: Not on file    Physically abused: Not on file    Forced sexual activity: Not on file  Other Topics Concern  . Not on file  Social History Narrative  . Not on file   Family History  Problem Relation Age of Onset  . Heart disease Mother   . Diabetes Mother   . Hypertension Father   . Prostate cancer Neg Hx   . Bladder Cancer Neg Hx   . Kidney cancer Neg Hx      Review of Systems  Constitutional: Negative.   Respiratory: Negative.   Cardiovascular: Negative.   Neurological: Negative.   Psychiatric/Behavioral: Negative.     Per HPI unless specifically indicated above     Objective:    BP (!) 190/106 (BP Location: Left Arm, Patient Position: Sitting, Cuff Size: Normal)   Temp 97.8 F (36.6 C)   Wt 200 lb 4.8 oz (90.9 kg)   BMI 27.17 kg/m   Wt Readings from Last 3 Encounters:  03/27/18 200 lb 4.8 oz (90.9 kg)  06/20/17 177 lb  11.2 oz (80.6 kg)  06/12/17 16 lb (7.258 kg)    Physical Exam  Constitutional: He is oriented to person, place, and time. He appears well-developed and well-nourished. No distress.  HENT:  Head: Normocephalic and atraumatic.  Right Ear: Hearing normal.  Left Ear: Hearing normal.  Nose: Nose normal.  Eyes: Conjunctivae and lids are normal. Right eye exhibits no discharge. Left eye exhibits no discharge. No scleral icterus.  Cardiovascular: Normal rate, normal heart sounds and intact distal pulses. An irregular rhythm present. Exam reveals no gallop and no friction rub.  No murmur heard. Pulmonary/Chest: Effort normal and breath sounds normal. No respiratory distress. He has no wheezes. He has no rales. He exhibits no tenderness.  Musculoskeletal: Normal range of motion.  Neurological: He is alert and oriented to person, place, and time.  Skin: Skin is warm, dry and intact. No rash noted. He is not diaphoretic. No erythema. No pallor.  Psychiatric: He has a normal mood and affect. His speech is normal and behavior is normal. Judgment and thought content normal. Cognition and memory are normal.  Vitals reviewed.   Results for orders placed or performed during the hospital encounter of 06/12/17  Urine Culture  Result Value Ref Range   Specimen Description URINE, RANDOM    Special Requests NONE    Culture      NO GROWTH Performed at [facility], [address], [place], [place] [address]    Report Status 06/13/2017 FINAL   Urinalysis, Complete w Microscopic  Result Value Ref Range   Color, Urine YELLOW (A) YELLOW   APPearance CLEAR (A) CLEAR   Specific Gravity, Urine 1.009 1.005 - 1.030   pH 7.0 5.0 - 8.0   Glucose, UA NEGATIVE NEGATIVE mg/dL   Hgb urine dipstick NEGATIVE NEGATIVE   Bilirubin Urine NEGATIVE NEGATIVE   Ketones, ur NEGATIVE NEGATIVE mg/dL   Protein, ur NEGATIVE NEGATIVE mg/dL   Nitrite NEGATIVE NEGATIVE   Leukocytes, UA NEGATIVE NEGATIVE   RBC / HPF 0-5 0  - 5 RBC/hpf   WBC, UA 0-5 0 - 5 WBC/hpf   Bacteria, UA NONE SEEN NONE SEEN   Squamous Epithelial / LPF NONE SEEN NONE SEEN  Basic metabolic  panel  Result Value Ref Range   Sodium 137 135 - 145 mmol/L   Potassium 3.9 3.5 - 5.1 mmol/L   Chloride 104 101 - 111 mmol/L   CO2 26 22 - 32 mmol/L   Glucose, Bld 95 65 - 99 mg/dL   BUN 10 6 - 20 mg/dL   Creatinine, Ser 1.61 (L) 0.61 - 1.24 mg/dL   Calcium 9.0 8.9 - 09.6 mg/dL   GFR calc non Af Amer >60 >60 mL/min   GFR calc Af Amer >60 >60 mL/min   Anion gap 7 5 - 15  CBC with Differential/Platelet  Result Value Ref Range   WBC 7.8 3.8 - 10.6 K/uL   RBC 5.76 4.40 - 5.90 MIL/uL   Hemoglobin 17.3 13.0 - 18.0 g/dL   HCT 04.5 40.9 - 81.1 %   MCV 87.6 80.0 - 100.0 fL   MCH 30.1 26.0 - 34.0 pg   MCHC 34.3 32.0 - 36.0 g/dL   RDW 91.4 78.2 - 95.6 %   Platelets 161 150 - 440 K/uL   Neutrophils Relative % 70 %   Neutro Abs 5.4 1.4 - 6.5 K/uL   Lymphocytes Relative 18 %   Lymphs Abs 1.4 1.0 - 3.6 K/uL   Monocytes Relative 10 %   Monocytes Absolute 0.8 0.2 - 1.0 K/uL   Eosinophils Relative 1 %   Eosinophils Absolute 0.1 0 - 0.7 K/uL   Basophils Relative 1 %   Basophils Absolute 0.1 0 - 0.1 K/uL      Assessment & Plan:   Problem List Items Addressed This Visit      Cardiovascular and Mediastinum   Hypertension - Primary    Not under good control. Will get restarted on medication, but with lower dose to avoid dropping him too low too fast. Will restart 20mg  lisinopril and 25mg  metprolol BID. Checking labs today. Recheck next week.       Relevant Medications   lisinopril (PRINIVIL,ZESTRIL) 20 MG tablet   metoprolol tartrate (LOPRESSOR) 25 MG tablet   Other Relevant Orders   CBC with Differential/Platelet   Comprehensive metabolic panel   TSH   UA/M w/rflx Culture, Routine     Other   Hyperlipidemia    Rechecking levels today. Treat as needed. Call with any concerns.       Relevant Medications   lisinopril (PRINIVIL,ZESTRIL) 20  MG tablet   metoprolol tartrate (LOPRESSOR) 25 MG tablet   Other Relevant Orders   CBC with Differential/Platelet   Comprehensive metabolic panel   Lipid Panel w/o Chol/HDL Ratio   TSH   UA/M w/rflx Culture, Routine   Irritability    Does well on prozac. Will restart now and get him into behavioral health. Call with any concerns.       Relevant Orders   CBC with Differential/Platelet   Comprehensive metabolic panel   TSH   UA/M w/rflx Culture, Routine    Other Visit Diagnoses    Irregular heartbeat       Will get him an EKG- has history of both a flutter and frequent PVCs. Await results. If A. Fib- needs anticoagulant.    Relevant Orders   CBC with Differential/Platelet   Comprehensive metabolic panel   TSH   UA/M w/rflx Culture, Routine   EKG 12-Lead   Screening for prostate cancer       Labs drawn today. Await results.    Relevant Orders   PSA       Follow up plan: Return 1 week-  follow up BP, for Needs EKG, eye and behavioral health appointmentt.

## 2018-03-27 NOTE — Assessment & Plan Note (Signed)
Rechecking levels today. Treat as needed. Call with any concerns.  

## 2018-03-27 NOTE — Assessment & Plan Note (Signed)
Does well on prozac. Will restart now and get him into behavioral health. Call with any concerns.

## 2018-03-28 LAB — LIPID PANEL W/O CHOL/HDL RATIO
Cholesterol, Total: 231 mg/dL — ABNORMAL HIGH (ref 100–199)
HDL: 39 mg/dL — ABNORMAL LOW (ref >39)
LDL Calculated: 158 mg/dL — ABNORMAL HIGH (ref 0–99)
Triglycerides: 169 mg/dL — ABNORMAL HIGH (ref 0–149)
VLDL Cholesterol Cal: 34 mg/dL (ref 5–40)

## 2018-03-28 LAB — CBC WITH DIFFERENTIAL/PLATELET
BASOS: 1 %
Basophils Absolute: 0.1 10*3/uL (ref 0.0–0.2)
EOS (ABSOLUTE): 0.2 10*3/uL (ref 0.0–0.4)
Eos: 3 %
HEMATOCRIT: 47.5 % (ref 37.5–51.0)
HEMOGLOBIN: 16.4 g/dL (ref 13.0–17.7)
IMMATURE GRANS (ABS): 0 10*3/uL (ref 0.0–0.1)
Immature Granulocytes: 0 %
LYMPHS ABS: 2.7 10*3/uL (ref 0.7–3.1)
LYMPHS: 40 %
MCH: 30.3 pg (ref 26.6–33.0)
MCHC: 34.5 g/dL (ref 31.5–35.7)
MCV: 88 fL (ref 79–97)
MONOCYTES: 8 %
Monocytes Absolute: 0.6 10*3/uL (ref 0.1–0.9)
Neutrophils Absolute: 3.2 10*3/uL (ref 1.4–7.0)
Neutrophils: 48 %
Platelets: 190 10*3/uL (ref 150–379)
RBC: 5.41 x10E6/uL (ref 4.14–5.80)
RDW: 14.7 % (ref 12.3–15.4)
WBC: 6.7 10*3/uL (ref 3.4–10.8)

## 2018-03-28 LAB — COMPREHENSIVE METABOLIC PANEL
A/G RATIO: 1.6 (ref 1.2–2.2)
ALBUMIN: 4.4 g/dL (ref 3.6–4.8)
ALK PHOS: 97 IU/L (ref 39–117)
ALT: 8 IU/L (ref 0–44)
AST: 16 IU/L (ref 0–40)
BILIRUBIN TOTAL: 0.4 mg/dL (ref 0.0–1.2)
BUN / CREAT RATIO: 10 (ref 10–24)
BUN: 10 mg/dL (ref 8–27)
CHLORIDE: 102 mmol/L (ref 96–106)
CO2: 21 mmol/L (ref 20–29)
Calcium: 9.2 mg/dL (ref 8.6–10.2)
Creatinine, Ser: 1.05 mg/dL (ref 0.76–1.27)
GFR calc non Af Amer: 75 mL/min/{1.73_m2} (ref >59)
GFR, EST AFRICAN AMERICAN: 87 mL/min/{1.73_m2} (ref >59)
GLOBULIN, TOTAL: 2.7 g/dL (ref 1.5–4.5)
Glucose: 78 mg/dL (ref 65–99)
POTASSIUM: 4.8 mmol/L (ref 3.5–5.2)
SODIUM: 140 mmol/L (ref 134–144)
TOTAL PROTEIN: 7.1 g/dL (ref 6.0–8.5)

## 2018-03-28 LAB — MICROSCOPIC EXAMINATION
Bacteria, UA: NONE SEEN
CASTS: NONE SEEN /LPF
Epithelial Cells (non renal): NONE SEEN /hpf (ref 0–10)

## 2018-03-28 LAB — UA/M W/RFLX CULTURE, ROUTINE
BILIRUBIN UA: NEGATIVE
GLUCOSE, UA: NEGATIVE
Ketones, UA: NEGATIVE
LEUKOCYTES UA: NEGATIVE
NITRITE UA: NEGATIVE
RBC UA: NEGATIVE
SPEC GRAV UA: 1.016 (ref 1.005–1.030)
Urobilinogen, Ur: 0.2 mg/dL (ref 0.2–1.0)
pH, UA: 6 (ref 5.0–7.5)

## 2018-03-28 LAB — TSH: TSH: 3.74 u[IU]/mL (ref 0.450–4.500)

## 2018-03-28 LAB — PSA: Prostate Specific Ag, Serum: 0.8 ng/mL (ref 0.0–4.0)

## 2018-04-03 ENCOUNTER — Ambulatory Visit: Payer: Self-pay

## 2018-04-08 ENCOUNTER — Observation Stay (HOSPITAL_BASED_OUTPATIENT_CLINIC_OR_DEPARTMENT_OTHER)
Admit: 2018-04-08 | Discharge: 2018-04-08 | Disposition: A | Discharge status: Home / Self Care | Payer: Self-pay | Attending: Internal Medicine | Admitting: Internal Medicine

## 2018-04-08 ENCOUNTER — Encounter: Payer: Self-pay | Admitting: Emergency Medicine

## 2018-04-08 ENCOUNTER — Observation Stay
Admission: EM | Admit: 2018-04-08 | Discharge: 2018-04-09 | Disposition: A | Discharge status: Home / Self Care | Payer: Self-pay | Attending: Internal Medicine | Admitting: Internal Medicine

## 2018-04-08 ENCOUNTER — Emergency Department: Payer: Self-pay

## 2018-04-08 ENCOUNTER — Other Ambulatory Visit: Payer: Self-pay

## 2018-04-08 DIAGNOSIS — I361 Nonrheumatic tricuspid (valve) insufficiency: Secondary | ICD-10-CM

## 2018-04-08 DIAGNOSIS — F17210 Nicotine dependence, cigarettes, uncomplicated: Secondary | ICD-10-CM | POA: Insufficient documentation

## 2018-04-08 DIAGNOSIS — F4325 Adjustment disorder with mixed disturbance of emotions and conduct: Secondary | ICD-10-CM | POA: Insufficient documentation

## 2018-04-08 DIAGNOSIS — Z8249 Family history of ischemic heart disease and other diseases of the circulatory system: Secondary | ICD-10-CM | POA: Insufficient documentation

## 2018-04-08 DIAGNOSIS — E785 Hyperlipidemia, unspecified: Secondary | ICD-10-CM | POA: Insufficient documentation

## 2018-04-08 DIAGNOSIS — J449 Chronic obstructive pulmonary disease, unspecified: Secondary | ICD-10-CM | POA: Insufficient documentation

## 2018-04-08 DIAGNOSIS — I493 Ventricular premature depolarization: Secondary | ICD-10-CM | POA: Insufficient documentation

## 2018-04-08 DIAGNOSIS — F1910 Other psychoactive substance abuse, uncomplicated: Secondary | ICD-10-CM

## 2018-04-08 DIAGNOSIS — I2510 Atherosclerotic heart disease of native coronary artery without angina pectoris: Principal | ICD-10-CM | POA: Insufficient documentation

## 2018-04-08 DIAGNOSIS — N281 Cyst of kidney, acquired: Secondary | ICD-10-CM | POA: Insufficient documentation

## 2018-04-08 DIAGNOSIS — I2582 Chronic total occlusion of coronary artery: Secondary | ICD-10-CM | POA: Insufficient documentation

## 2018-04-08 DIAGNOSIS — I700 Atherosclerosis of aorta: Secondary | ICD-10-CM | POA: Insufficient documentation

## 2018-04-08 DIAGNOSIS — I429 Cardiomyopathy, unspecified: Secondary | ICD-10-CM | POA: Insufficient documentation

## 2018-04-08 DIAGNOSIS — I472 Ventricular tachycardia: Secondary | ICD-10-CM | POA: Insufficient documentation

## 2018-04-08 DIAGNOSIS — F1410 Cocaine abuse, uncomplicated: Secondary | ICD-10-CM | POA: Insufficient documentation

## 2018-04-08 DIAGNOSIS — I110 Hypertensive heart disease with heart failure: Secondary | ICD-10-CM | POA: Insufficient documentation

## 2018-04-08 DIAGNOSIS — I4892 Unspecified atrial flutter: Secondary | ICD-10-CM | POA: Insufficient documentation

## 2018-04-08 DIAGNOSIS — Z590 Homelessness: Secondary | ICD-10-CM | POA: Insufficient documentation

## 2018-04-08 DIAGNOSIS — I509 Heart failure, unspecified: Secondary | ICD-10-CM

## 2018-04-08 DIAGNOSIS — J90 Pleural effusion, not elsewhere classified: Secondary | ICD-10-CM | POA: Insufficient documentation

## 2018-04-08 DIAGNOSIS — I5043 Acute on chronic combined systolic (congestive) and diastolic (congestive) heart failure: Secondary | ICD-10-CM | POA: Insufficient documentation

## 2018-04-08 LAB — BASIC METABOLIC PANEL
ANION GAP: 6 (ref 5–15)
BUN: 16 mg/dL (ref 6–20)
CALCIUM: 8.7 mg/dL — AB (ref 8.9–10.3)
CO2: 28 mmol/L (ref 22–32)
Chloride: 103 mmol/L (ref 101–111)
Creatinine, Ser: 0.81 mg/dL (ref 0.61–1.24)
GFR calc Af Amer: >60 mL/min (ref >60)
GFR calc non Af Amer: >60 mL/min (ref >60)
GLUCOSE: 115 mg/dL — AB (ref 65–99)
Potassium: 4.3 mmol/L (ref 3.5–5.1)
Sodium: 137 mmol/L (ref 135–145)

## 2018-04-08 LAB — URINE DRUG SCREEN, QUALITATIVE (ARMC ONLY)
Amphetamines, Ur Screen: NOT DETECTED
Barbiturates, Ur Screen: NOT DETECTED
Benzodiazepine, Ur Scrn: NOT DETECTED
CANNABINOID 50 NG, UR ~~LOC~~: NOT DETECTED
COCAINE METABOLITE, UR ~~LOC~~: NOT DETECTED
MDMA (Ecstasy)Ur Screen: NOT DETECTED
Methadone Scn, Ur: NOT DETECTED
OPIATE, UR SCREEN: NOT DETECTED
PHENCYCLIDINE (PCP) UR S: NOT DETECTED
Tricyclic, Ur Screen: NOT DETECTED

## 2018-04-08 LAB — CBC
HCT: 48.8 % (ref 40.0–52.0)
Hemoglobin: 16.4 g/dL (ref 13.0–18.0)
MCH: 29.9 pg (ref 26.0–34.0)
MCHC: 33.6 g/dL (ref 32.0–36.0)
MCV: 89.1 fL (ref 80.0–100.0)
Platelets: 212 10*3/uL (ref 150–440)
RBC: 5.47 MIL/uL (ref 4.40–5.90)
RDW: 14.6 % — ABNORMAL HIGH (ref 11.5–14.5)
WBC: 8.5 10*3/uL (ref 3.8–10.6)

## 2018-04-08 LAB — MAGNESIUM: MAGNESIUM: 2 mg/dL (ref 1.7–2.4)

## 2018-04-08 LAB — HEMOGLOBIN A1C
Hgb A1c MFr Bld: 5.3 % (ref 4.8–5.6)
Mean Plasma Glucose: 105.41 mg/dL

## 2018-04-08 LAB — TROPONIN I: TROPONIN I: 0.03 ng/mL — AB (ref <0.03)

## 2018-04-08 LAB — TSH: TSH: 3.436 u[IU]/mL (ref 0.350–4.500)

## 2018-04-08 MED ORDER — CARVEDILOL 3.125 MG PO TABS
3.1250 mg | ORAL_TABLET | Freq: Two times a day (BID) | ORAL | Status: DC
Start: 2018-04-08 — End: 2018-04-09
  Administered 2018-04-08 – 2018-04-09 (×3): 3.125 mg via ORAL
  Filled 2018-04-08 (×3): qty 1

## 2018-04-08 MED ORDER — ACETAMINOPHEN 650 MG RE SUPP
650.0000 mg | Freq: Four times a day (QID) | RECTAL | Status: DC | PRN
Start: 2018-04-08 — End: 2018-04-09

## 2018-04-08 MED ORDER — IPRATROPIUM-ALBUTEROL 0.5-2.5 (3) MG/3ML IN SOLN
3.0000 mL | Freq: Once | RESPIRATORY_TRACT | Status: AC
  Administered 2018-04-08: 3 mL via RESPIRATORY_TRACT

## 2018-04-08 MED ORDER — FLUTICASONE PROPIONATE 50 MCG/ACT NA SUSP
2.0000 | Freq: Every day | NASAL | Status: DC
  Administered 2018-04-08 – 2018-04-09 (×2): 2 via NASAL
  Filled 2018-04-08: qty 16

## 2018-04-08 MED ORDER — ONDANSETRON HCL 4 MG/2ML IJ SOLN: 4.0000 mg | Freq: Four times a day (QID) | INTRAMUSCULAR | Status: DC | PRN

## 2018-04-08 MED ORDER — FUROSEMIDE 10 MG/ML IJ SOLN
INTRAMUSCULAR | Status: AC
  Administered 2018-04-08: 40 mg via INTRAVENOUS
  Filled 2018-04-08: qty 4

## 2018-04-08 MED ORDER — LISINOPRIL 20 MG PO TABS
20.0000 mg | ORAL_TABLET | Freq: Every day | ORAL | Status: DC
  Administered 2018-04-08: 20 mg via ORAL
  Filled 2018-04-08: qty 1

## 2018-04-08 MED ORDER — IPRATROPIUM-ALBUTEROL 0.5-2.5 (3) MG/3ML IN SOLN
3.0000 mL | RESPIRATORY_TRACT | Status: DC
Start: 2018-04-08 — End: 2018-04-09
  Administered 2018-04-08 – 2018-04-09 (×6): 3 mL via RESPIRATORY_TRACT
  Filled 2018-04-08 (×5): qty 3

## 2018-04-08 MED ORDER — FUROSEMIDE 10 MG/ML IJ SOLN
40.0000 mg | Freq: Once | INTRAMUSCULAR | Status: AC
  Administered 2018-04-08: 40 mg via INTRAVENOUS
  Filled 2018-04-08: qty 4

## 2018-04-08 MED ORDER — GUAIFENESIN-DM 100-10 MG/5ML PO SYRP
15.0000 mL | ORAL_SOLUTION | ORAL | Status: DC | PRN
  Administered 2018-04-08 (×2): 15 mL via ORAL
  Filled 2018-04-08 (×2): qty 15

## 2018-04-08 MED ORDER — METOPROLOL TARTRATE 25 MG PO TABS: 25.0000 mg | ORAL_TABLET | Freq: Two times a day (BID) | ORAL | Status: DC

## 2018-04-08 MED ORDER — ONDANSETRON HCL 4 MG PO TABS: 4.0000 mg | ORAL_TABLET | Freq: Four times a day (QID) | ORAL | Status: DC | PRN

## 2018-04-08 MED ORDER — IOHEXOL 350 MG/ML SOLN
75.0000 mL | Freq: Once | INTRAVENOUS | Status: AC | PRN
  Administered 2018-04-08: 75 mL via INTRAVENOUS

## 2018-04-08 MED ORDER — METHYLPREDNISOLONE SODIUM SUCC 125 MG IJ SOLR
60.0000 mg | Freq: Two times a day (BID) | INTRAMUSCULAR | Status: DC
  Administered 2018-04-08 – 2018-04-09 (×3): 60 mg via INTRAVENOUS
  Filled 2018-04-08 (×3): qty 2

## 2018-04-08 MED ORDER — IPRATROPIUM-ALBUTEROL 0.5-2.5 (3) MG/3ML IN SOLN
RESPIRATORY_TRACT | Status: AC
  Administered 2018-04-08: 3 mL via RESPIRATORY_TRACT
  Filled 2018-04-08: qty 9

## 2018-04-08 MED ORDER — GUAIFENESIN ER 600 MG PO TB12
600.0000 mg | ORAL_TABLET | Freq: Two times a day (BID) | ORAL | Status: DC
  Administered 2018-04-08 – 2018-04-09 (×3): 600 mg via ORAL
  Filled 2018-04-08 (×5): qty 1

## 2018-04-08 MED ORDER — NICOTINE 21 MG/24HR TD PT24
21.0000 mg | MEDICATED_PATCH | Freq: Every day | TRANSDERMAL | Status: DC
  Administered 2018-04-08 – 2018-04-09 (×2): 21 mg via TRANSDERMAL
  Filled 2018-04-08 (×2): qty 1

## 2018-04-08 MED ORDER — METHYLPREDNISOLONE SODIUM SUCC 125 MG IJ SOLR
INTRAMUSCULAR | Status: AC
  Administered 2018-04-08: 125 mg via INTRAVENOUS
  Filled 2018-04-08: qty 2

## 2018-04-08 MED ORDER — FUROSEMIDE 10 MG/ML IJ SOLN
40.0000 mg | Freq: Once | INTRAMUSCULAR | Status: AC
  Administered 2018-04-08: 40 mg via INTRAVENOUS

## 2018-04-08 MED ORDER — ENOXAPARIN SODIUM 40 MG/0.4ML ~~LOC~~ SOLN
40.0000 mg | SUBCUTANEOUS | Status: DC
  Administered 2018-04-08: 40 mg via SUBCUTANEOUS
  Filled 2018-04-08 (×2): qty 0.4

## 2018-04-08 MED ORDER — NICOTINE 21 MG/24HR TD PT24: 21.0000 mg | MEDICATED_PATCH | Freq: Every day | TRANSDERMAL | Status: DC

## 2018-04-08 MED ORDER — ACETAMINOPHEN 325 MG PO TABS
650.0000 mg | ORAL_TABLET | Freq: Four times a day (QID) | ORAL | Status: DC | PRN
  Administered 2018-04-08: 650 mg via ORAL
  Filled 2018-04-08: qty 2

## 2018-04-08 MED ORDER — METHYLPREDNISOLONE SODIUM SUCC 125 MG IJ SOLR
125.0000 mg | Freq: Once | INTRAMUSCULAR | Status: AC
  Administered 2018-04-08: 125 mg via INTRAVENOUS

## 2018-04-08 MED ORDER — DOCUSATE SODIUM 100 MG PO CAPS
100.0000 mg | ORAL_CAPSULE | Freq: Two times a day (BID) | ORAL | Status: DC
  Administered 2018-04-08 – 2018-04-09 (×2): 100 mg via ORAL
  Filled 2018-04-08 (×3): qty 1

## 2018-04-08 NOTE — Progress Notes (Signed)
[facility] [place] at [facility]                                                                                                                                                                                  Patient Demographics   Alex Winters, is a 62 y.o. male, DOB - October 19, 1956, ZOX:[id number]  Admit date - 04/08/2018   Admitting Physician Arnaldo Natal, MD  Outpatient Primary MD for the patient is Patient, No Pcp Per   LOS - 0  Subjective: Patient states that breathing is improved. Also complains of cough congestion intermittent wheeze    Review of Systems:   CONSTITUTIONAL: No documented fever. No fatigue, weakness. No weight gain, no weight loss.  EYES: No blurry or double vision.  ENT: No tinnitus. No postnasal drip. No redness of the oropharynx.  RESPIRATORY: Positive cough, positive wheeze, no hemoptysis.  Positive dyspnea.  CARDIOVASCULAR: No chest pain. No orthopnea. No palpitations. No syncope.  GASTROINTESTINAL: No nausea, no vomiting or diarrhea. No abdominal pain. No melena or hematochezia.  GENITOURINARY: No dysuria or hematuria.  ENDOCRINE: No polyuria or nocturia. No heat or cold intolerance.  HEMATOLOGY: No anemia. No bruising. No bleeding.  INTEGUMENTARY: No rashes. No lesions.  MUSCULOSKELETAL: No arthritis. No swelling. No gout.  NEUROLOGIC: No numbness, tingling, or ataxia. No seizure-type activity.  PSYCHIATRIC: No anxiety. No insomnia. No ADD.    Vitals:   Vitals:   04/08/18 0648 04/08/18 0843 04/08/18 0856 04/08/18 1154  BP: (!) 162/150 138/64    Pulse: 95 98 88   Resp:  20    Temp: 97.9 F (36.6 C) (!) 97.5 F (36.4 C)    TempSrc: Oral Oral    SpO2: 98% 95%  96%  Weight:      Height:        Wt Readings from Last 3 Encounters:  04/08/18 87.1 kg (192 lb 1.6 oz)  03/27/18 90.9 kg (200 lb 4.8 oz)  06/20/17 80.6 kg (177 lb 11.2 oz)     Intake/Output Summary (Last 24 hours) at 04/08/2018 1431 Last data filed at  04/08/2018 1411 Gross per 24 hour  Intake 840 ml  Output 325 ml  Net 515 ml    Physical Exam:   GENERAL: Pleasant-appearing in no apparent distress.  HEAD, EYES, EARS, NOSE AND THROAT: Atraumatic, normocephalic. Extraocular muscles are intact. Pupils equal and reactive to light. Sclerae anicteric. No conjunctival injection. No oro-pharyngeal erythema.  NECK: Supple. There is no jugular venous distention. No bruits, no lymphadenopathy, no thyromegaly.  HEART: Regular rate and rhythm,. No murmurs, no rubs, no clicks.  LUNGS: Diminished breath sounds no accessory muscle usage  aBDOMEN: Soft, flat,  nontender, nondistended. Has good bowel sounds. No hepatosplenomegaly appreciated.  EXTREMITIES: No evidence of any cyanosis, clubbing, or 1+ peripheral edema.  +2 pedal and radial pulses bilaterally.  NEUROLOGIC: The patient is alert, awake, and oriented x3 with no focal motor or sensory deficits appreciated bilaterally.  SKIN: Moist and warm with no rashes appreciated.  Psych: Not anxious, depressed LN: No inguinal LN enlargement    Antibiotics   Anti-infectives (From admission, onward)   None      Medications   Scheduled Meds: . carvedilol  3.125 mg Oral BID WC  . docusate sodium  100 mg Oral BID  . enoxaparin (LOVENOX) injection  40 mg Subcutaneous Q24H  . fluticasone  2 spray Each Nare Daily  . guaiFENesin  600 mg Oral BID  . ipratropium-albuterol  3 mL Nebulization Q4H  . lisinopril  20 mg Oral Daily  . methylPREDNISolone (SOLU-MEDROL) injection  60 mg Intravenous Q12H  . nicotine  21 mg Transdermal Daily   Continuous Infusions: PRN Meds:.acetaminophen **OR** acetaminophen, guaiFENesin-dextromethorphan, ondansetron **OR** ondansetron (ZOFRAN) IV   Data Review:   Micro Results No results found for this or any previous visit (from the past 240 hour(s)).  Radiology Reports Ct Angio Chest Pe W And/or Wo Contrast  Result Date: 04/08/2018 CLINICAL DATA:  62 y/o M; increasing  shortness of breath with productive cough. EXAM: CT ANGIOGRAPHY CHEST WITH CONTRAST TECHNIQUE: Multidetector CT imaging of the chest was performed using the standard protocol during bolus administration of intravenous contrast. Multiplanar CT image reconstructions and MIPs were obtained to evaluate the vascular anatomy. CONTRAST:  75mL OMNIPAQUE IOHEXOL 350 MG/ML SOLN COMPARISON:  None. FINDINGS: Cardiovascular: Mild cardiomegaly. Moderate coronary artery calcific atherosclerosis. Normal caliber thoracic aorta with calcific atherosclerosis. Normal caliber main pulmonary artery. Satisfactory opacification of pulmonary arteries. No pulmonary embolus identified Mediastinum/Nodes: No enlarged mediastinal, hilar, or axillary lymph nodes. Thyroid gland, trachea, and esophagus demonstrate no significant findings. Lungs/Pleura: Several small peripheral pulmonary nodules measuring up to 6 mm at the right lung base (series 11, image 77). Moderate centrilobular emphysema greatest in upper lobes. Smooth interlobular septal thickening, peribronchial thickening, small pleural effusions, and hazy opacities in the lung bases. Upper Abdomen: 6 cm fluid attenuating well-circumscribed lesion in left kidney with thin dense septate. Small hiatal hernia. Musculoskeletal: No chest wall abnormality. No acute or significant osseous findings. Review of the MIP images confirms the above findings. IMPRESSION: 1. Cardiomegaly, interstitial pulmonary edema, small bilateral pleural effusions. 2. No pulmonary embolus identified. 3. Scattered pulmonary nodules measuring up to 6 mm. Non-contrast chest CT at 3-6 months is recommended. If the nodules are stable at time of repeat CT, then future CT at 18-24 months (from today's scan) is considered optional for low-risk patients, but is recommended for high-risk patients. This recommendation follows the consensus statement: Guidelines for Management of Incidental Pulmonary Nodules Detected on CT Images:  From the Fleischner Society 2017; Radiology 2017; 284:228-243. 4. Moderate centrilobular emphysema with upper lobe predominance. 5. Partially visualized mildly complex left kidney cyst. Renal protocol CT or MRI is recommended to further characterize on nonemergent basis. 6. Coronary and aortic calcific atherosclerosis. Electronically Signed   By: Mitzi HansenLance  Furusawa-Stratton M.D.   On: 04/08/2018 04:46   Dg Chest Portable 1 View  Result Date: 04/08/2018 CLINICAL DATA:  Dyspnea EXAM: PORTABLE CHEST 1 VIEW COMPARISON:  02/06/2017 FINDINGS: Stable cardiomegaly with upper lobe predominant emphysema. Fine reticular markings noted of the mid and lower lung bilaterally, likely reflecting chronic interstitial change. No overt pulmonary edema. There  is aortic atherosclerosis at the arch without aneurysm. IMPRESSION: Upper lobe predominant emphysematous hyperinflation. Stable cardiomegaly with aortic atherosclerosis. No active pulmonary disease. Electronically Signed   By: Tollie Eth M.D.   On: 04/08/2018 03:58     CBC Recent Labs  Lab 04/08/18 0300  WBC 8.5  HGB 16.4  HCT 48.8  PLT 212  MCV 89.1  MCH 29.9  MCHC 33.6  RDW 14.6*    Chemistries  Recent Labs  Lab 04/08/18 0300  NA 137  K 4.3  CL 103  CO2 28  GLUCOSE 115*  BUN 16  CREATININE 0.81  CALCIUM 8.7*  MG 2.0   ------------------------------------------------------------------------------------------------------------------ estimated creatinine clearance is 108.5 mL/min (by C-G formula based on SCr of 0.81 mg/dL). ------------------------------------------------------------------------------------------------------------------ Recent Labs    04/08/18 0300  HGBA1C 5.3   ------------------------------------------------------------------------------------------------------------------ No results for input(s): CHOL, HDL, LDLCALC, TRIG, CHOLHDL, LDLDIRECT in the last 72  hours. ------------------------------------------------------------------------------------------------------------------ Recent Labs    04/08/18 0300  TSH 3.436   ------------------------------------------------------------------------------------------------------------------ No results for input(s): VITAMINB12, FOLATE, FERRITIN, TIBC, IRON, RETICCTPCT in the last 72 hours.  Coagulation profile No results for input(s): INR, PROTIME in the last 168 hours.  No results for input(s): DDIMER in the last 72 hours.  Cardiac Enzymes Recent Labs  Lab 04/08/18 0300  TROPONINI 0.03*   ------------------------------------------------------------------------------------------------------------------ Invalid input(s): POCBNP    Assessment & Plan   Patient 62 year old presenting with shortness of breath  1.  Acute CHF type unknown has received Lasix and has diuresed well Continue lisinopril, continue Coreg  2.  Acute COPD exasperation We will place patient on nebulizers Steroid May need additional inhaler  3.  Nicotine abuse smoking cessation provided 4 minutes spent patient started on nicotine replacement       Code Status Orders  (From admission, onward)        Start     Ordered   04/08/18 0650  Full code  Continuous     04/08/18 0649    Code Status History    Date Active Date Inactive Code Status Order ID Comments User Context   02/06/2017 1204 02/07/2017 1426 Full Code [id number]  Katha Hamming, MD ED           Consults cardiology  DVT Prophylaxis  Lovenox  Lab Results  Component Value Date   PLT 212 04/08/2018     Time Spent in minutes   35 minutes greater than 50% of time spent in care coordination and counseling patient regarding the condition and plan of care.   Auburn Bilberry M.D on 04/08/2018 at 2:31 PM  Between 7am to 6pm - Pager - [phone]  After 6pm go to [email or web address] - Social research officer, government  [employer]   Office   [phone]

## 2018-04-08 NOTE — Consult Note (Signed)
Cardiology Consultation:   Patient ID: Alex Winters; [id number]; [birth year]   Admit date: 04/08/2018 Date of Consult: 04/08/2018  Primary Care Provider: Patient, No Pcp Per Primary Cardiologist: End (never followed up as an outpatient)   Patient Profile:   Alex Winters is a 62 y.o. male with a hx of chronic diastolic CHF, atrial flutter not on anticoagulation, hypertension, and polysubstance abuse who is being seen today for the evaluation of CHF at the request of Dr. Sheryle Hail.  History of Present Illness:   Alex Winters was previously admitted to [facility] in 01/2017 with the diagnosis of new onset atrial flutter with RVR.  He was noted to be cocaine positive.  He converted to sinus rhythm with diltiazem at that time.  He was started on Eliquis.  Echocardiogram showed an EF of 55-60%, grade 2 diastolic dysfunction, mildly calcified aortic annulus, mildly dilated aortic root, mildly calcified mitral annulus with trivial mitral regurgitation, mildly dilated right and left atrium, mildly dilated RV with normal systolic function.  He did not follow-up with cardiology as an outpatient.  He reports he was doing well and had even stopped using cocaine (last used 12/2017 per his report).  However, over the past 3 weeks he has noted increased shortness of breath, cough productive of clear to white sputum, and orthopnea.  He is currently homeless, sleeping on a friend's sofa.  He had self discontinued all of his medications until approximately 2 weeks prior when [facility] and was resumed on lisinopril and metoprolol.  He reports he never filled the Eliquis as he could not afford it.  He has not been on a home diuretic.  He is now having to sleep sitting mostly sitting up.  Some associated chest tightness as well as lower extremity swelling.  He indicates his weight is up 20 pounds since 12/2017 though he has attributed this to increased appetite following cessation of cocaine  abuse.  He reports he became significantly short of breath approximately 3 weeks ago though symptoms spontaneously improved though did not resolve.  He told himself if he got that short of breath again he would present to the ED for further evaluation.  Overnight, on 4/8 he developed sudden onset of shortness of breath prompting him to come to the ED for evaluation.  Upon the patient's arrival to [facility] they were found to have elevated blood pressure in the 160s-180s systolic, be afebrile, oxygen saturation mid 90s% on room air, weight between 192 and 200 pounds, uncertain accuracy. EKG showed sinus rhythm with frequent ventricular ectopy as detailed below, CXR showed emphysema with hyperinflation, stable cardiomegaly with aortic atherosclerosis.  CTA of the chest was negative for PE though did show cardiomegaly with interstitial pulmonary edema and small bilateral pleural effusions.  Scattered pulmonary nodules were incidentally noted.  Labs showed troponin of 0.03 and not cycled, urine drug screen negative, serum creatinine 0.81, potassium 4.3, white blood cell count 8.5, hemoglobin 16.4, platelet count 212, TSH 3.436, A1c 5.3, magnesium 2.0.  Since his admission, he has continued to have frequent PVCs along with an 8 beat run of NSVT.  In the ED, he was given IV Lasix 40 mg x1 with minimal documented urine output.  Upon admission it appears he was continued on home medications and cardiology was asked to evaluate.  Past Medical History:  Diagnosis Date  . Allergy    Nuts  . Hypertension   . New onset atrial flutter (HCC)  02/06/2017    Past Surgical History:  Procedure Laterality Date  . KNEE SURGERY    . TONSILLECTOMY       Home Meds: Prior to Admission medications   Medication Sig Start Date End Date Taking? Authorizing Provider  lisinopril (PRINIVIL,ZESTRIL) 20 MG tablet Take 1 tablet (20 mg total) by mouth daily. 03/27/18  Yes Johnson, Megan P, DO  metoprolol tartrate (LOPRESSOR) 25 MG tablet  Take 1 tablet (25 mg total) by mouth 2 (two) times daily. 03/27/18  Yes Johnson, Megan P, DO  FLUoxetine (PROZAC) 20 MG capsule Take 1 capsule (20 mg total) by mouth daily. Patient not taking: Reported on 04/08/2018 03/27/18 03/27/19  Dorcas Carrow, DO    Inpatient Medications: Scheduled Meds: . carvedilol  3.125 mg Oral BID WC  . docusate sodium  100 mg Oral BID  . enoxaparin (LOVENOX) injection  40 mg Subcutaneous Q24H  . fluticasone  2 spray Each Nare Daily  . furosemide  40 mg Intravenous Once  . guaiFENesin  600 mg Oral BID  . ipratropium-albuterol  3 mL Nebulization Q4H  . lisinopril  20 mg Oral Daily  . methylPREDNISolone (SOLU-MEDROL) injection  60 mg Intravenous Q12H  . nicotine  21 mg Transdermal Daily   Continuous Infusions:  PRN Meds: acetaminophen **OR** acetaminophen, guaiFENesin-dextromethorphan, ondansetron **OR** ondansetron (ZOFRAN) IV  Allergies:   Allergies  Allergen Reactions  . Peanut-Containing Drug Products Other (See Comments)    Abdominal discomfort  . Statins Other (See Comments)    Myalgias, Zocor, lipitor Reaction: joint pain     Social History:   Social History   Socioeconomic History  . Marital status: Divorced    Spouse name: Not on file  . Number of children: Not on file  . Years of education: Not on file  . Highest education level: Not on file  Occupational History  . Not on file  Social Needs  . Financial resource strain: Not on file  . Food insecurity:    Worry: Not on file    Inability: Not on file  . Transportation needs:    Medical: Not on file    Non-medical: Not on file  Tobacco Use  . Smoking status: Current Every Day Smoker    Packs/day: 0.50    Types: Cigarettes  . Smokeless tobacco: Never Used  Substance and Sexual Activity  . Alcohol use: No  . Drug use: No    Comment: clean from Crack since 02/07/13.  Marland Kitchen Sexual activity: Not on file  Lifestyle  . Physical activity:    Days per week: Not on file    Minutes per  session: Not on file  . Stress: Not on file  Relationships  . Social connections:    Talks on phone: Not on file    Gets together: Not on file    Attends religious service: Not on file    Active member of club or organization: Not on file    Attends meetings of clubs or organizations: Not on file    Relationship status: Not on file  . Intimate partner violence:    Fear of current or ex partner: Not on file    Emotionally abused: Not on file    Physically abused: Not on file    Forced sexual activity: Not on file  Other Topics Concern  . Not on file  Social History Narrative  . Not on file     Family History:   Family History  Problem Relation Age of Onset  .  Heart disease Mother   . Diabetes Mother   . Hypertension Father   . Prostate cancer Neg Hx   . Bladder Cancer Neg Hx   . Kidney cancer Neg Hx     ROS:  Review of Systems  Constitutional: Positive for malaise/fatigue. Negative for chills, diaphoresis, fever and weight loss.  HENT: Negative for congestion.   Eyes: Negative for discharge and redness.  Respiratory: Positive for cough, sputum production and shortness of breath. Negative for hemoptysis and wheezing.        Clear to white sputum  Cardiovascular: Positive for orthopnea and leg swelling. Negative for chest pain, palpitations, claudication and PND.  Gastrointestinal: Negative for abdominal pain, blood in stool, heartburn, melena, nausea and vomiting.  Genitourinary: Negative for hematuria.  Musculoskeletal: Negative for falls and myalgias.  Skin: Negative for rash.  Neurological: Positive for weakness. Negative for dizziness, tingling, tremors, sensory change, speech change, focal weakness and loss of consciousness.  Endo/Heme/Allergies: Does not bruise/bleed easily.  Psychiatric/Behavioral: Negative for substance abuse. The patient is not nervous/anxious.   All other systems reviewed and are negative.     Physical Exam/Data:   Vitals:   04/08/18 0647  04/08/18 0648 04/08/18 0843 04/08/18 0856  BP:  (!) 162/150 138/64   Pulse:  95 98 88  Resp:   20   Temp:  97.9 F (36.6 C) (!) 97.5 F (36.4 C)   TempSrc:  Oral Oral   SpO2:  98% 95%   Weight: 192 lb 1.6 oz (87.1 kg)     Height:        Intake/Output Summary (Last 24 hours) at 04/08/2018 1124 Last data filed at 04/08/2018 1011 Gross per 24 hour  Intake 480 ml  Output 325 ml  Net 155 ml   Filed Weights   04/08/18 0247 04/08/18 0647  Weight: 200 lb (90.7 kg) 192 lb 1.6 oz (87.1 kg)   Body mass index is 24.66 kg/m.   Physical Exam: General: Well developed, well nourished, in no acute distress. Head: Normocephalic, atraumatic, sclera non-icteric, no xanthomas, nares without discharge.  Neck: Negative for carotid bruits. JVD not elevated. Lungs: Diminished breath sounds bilaterally. Breathing is unlabored. Heart: RRR with S1 S2. No murmurs, rubs, or gallops appreciated. Abdomen: Soft, non-tender, non-distended with normoactive bowel sounds. No hepatomegaly. No rebound/guarding. No obvious abdominal masses. Msk:  Strength and tone appear normal for age. Extremities: No clubbing or cyanosis. No edema. Distal pedal pulses are 2+ and equal bilaterally. Neuro: Alert and oriented X 3. No facial asymmetry. No focal deficit. Moves all extremities spontaneously. Psych:  Responds to questions appropriately with a normal affect.   EKG:  The EKG was personally reviewed and demonstrates: NSR, 94 bpm, frequent PVCs, 4 beat run of NSVT nonspecific ST-T changes Telemetry:  Telemetry was personally reviewed and demonstrates: NSR with frequent PVCs and 8 beat run of NSVT  Weights: Filed Weights   04/08/18 0247 04/08/18 0647  Weight: 200 lb (90.7 kg) 192 lb 1.6 oz (87.1 kg)    Relevant CV Studies: TTE 02/07/17: Study Conclusions  - Left ventricle: The cavity size was normal. Wall thickness was   increased in a pattern of mild LVH. Systolic function was normal.   The estimated ejection  fraction was in the range of 55% to 60%.   Features are consistent with a pseudonormal left ventricular   filling pattern, with concomitant abnormal relaxation and   increased filling pressure (grade 2 diastolic dysfunction). - Aortic valve: Mildly calcified annulus. Trileaflet. Transvalvular  velocity was within the normal range. There was no stenosis.   There was no regurgitation. - Aortic root: The aortic root was mildly dilated. - Mitral valve: Mildly calcified annulus. There was trivial   regurgitation. - Left atrium: The atrium was mildly dilated. - Right ventricle: The cavity size was mildly dilated. Systolic   function was normal. - Right atrium: The atrium was mildly dilated.  Laboratory Data:  Chemistry Recent Labs  Lab 04/08/18 0300  NA 137  K 4.3  CL 103  CO2 28  GLUCOSE 115*  BUN 16  CREATININE 0.81  CALCIUM 8.7*  GFRNONAA >60  GFRAA >60  ANIONGAP 6    No results for input(s): PROT, ALBUMIN, AST, ALT, ALKPHOS, BILITOT in the last 168 hours. Hematology Recent Labs  Lab 04/08/18 0300  WBC 8.5  RBC 5.47  HGB 16.4  HCT 48.8  MCV 89.1  MCH 29.9  MCHC 33.6  RDW 14.6*  PLT 212   Cardiac Enzymes Recent Labs  Lab 04/08/18 0300  TROPONINI 0.03*   No results for input(s): TROPIPOC in the last 168 hours.  BNPNo results for input(s): BNP, PROBNP in the last 168 hours.  DDimer No results for input(s): DDIMER in the last 168 hours.  Radiology/Studies:  Ct Angio Chest Pe W And/or Wo Contrast  Result Date: 04/08/2018 IMPRESSION: 1. Cardiomegaly, interstitial pulmonary edema, small bilateral pleural effusions. 2. No pulmonary embolus identified. 3. Scattered pulmonary nodules measuring up to 6 mm. Non-contrast chest CT at 3-6 months is recommended. If the nodules are stable at time of repeat CT, then future CT at 18-24 months (from today's scan) is considered optional for low-risk patients, but is recommended for high-risk patients. This recommendation follows  the consensus statement: Guidelines for Management of Incidental Pulmonary Nodules Detected on CT Images: From the Fleischner Society 2017; Radiology 2017; 284:228-243. 4. Moderate centrilobular emphysema with upper lobe predominance. 5. Partially visualized mildly complex left kidney cyst. Renal protocol CT or MRI is recommended to further characterize on nonemergent basis. 6. Coronary and aortic calcific atherosclerosis. Electronically Signed   By: Mitzi Hansen M.D.   On: 04/08/2018 04:46   Dg Chest Portable 1 View  Result Date: 04/08/2018 IMPRESSION: Upper lobe predominant emphysematous hyperinflation. Stable cardiomegaly with aortic atherosclerosis. No active pulmonary disease. Electronically Signed   By: Tollie Eth M.D.   On: 04/08/2018 03:58    Assessment and Plan:   1.  Acute on chronic diastolic CHF: -Cannot rule out LV systolic dysfunction at this time given his symptoms and ventricular ectopy noted on telemetry -Continues to appear mildly volume overloaded on exam -IV diuresis with another 40 mg of Lasix to be given at 1300 today -Echocardiogram pending -Coreg and lisinopril -CHF education -Daily weights with strict I's and O's  2.  Elevated troponin: -Initial troponin mildly elevated at 0.03, not cycled -Trend troponins until peak or plateau -Hold off on initiating heparin drip at this time unless there is dynamic elevation of troponin -Check echocardiogram as above -We will need cardiac catheterization as detailed below  3.  Frequent PVCs/NSVT: -Concerning for ischemia -Magnesium and potassium at goal -Start carvedilol 3.125 mg twice daily, titrate as indicated -Await echocardiogram -Will need ischemic evaluation with right and left cardiac catheterization on 4/10  4.  Atrial flutter: -Maintaining sinus rhythm -Has not been on oral anticoagulation for the past 12 months -CHADS2VASc at least 3 (CHF, HTN, vascular disease) -Consider EP evaluation as an  outpatient for possible atrial flutter ablation -We will need  to discuss oral anticoagulation with him prior to discharge  5.  Polysubstance abuse: -Urine drug screen negative -Tobacco cessation advised  6.  Possible COPD exacerbation: -Per IM  7.  Hypertension: -Improved -Continue medications as above  8.  Hyperlipidemia: -Patient reports he is intolerant to statins secondary to myalgias -Consider initiation of Zetia   For questions or updates, please contact [facility] Please consult [email or web address] for contact info under Cardiology/STEMI.   Signed, Eula Listen, PA-C [facility] Pager: [phone] 04/08/2018, 11:24 AM

## 2018-04-08 NOTE — ED Provider Notes (Signed)
[facility] Emergency Department Provider Note   First MD Initiated Contact with Patient 04/08/18 0600     (approximate)  I have reviewed the triage vital signs and the nursing notes.   HISTORY  Chief Complaint Shortness of Breath    HPI Alex Winters is a 62 y.o. male below list of chronic medical conditions including cocaine abuse atrial flutter which patient was prescribed Eliquis which he never filled the prescription presents to the emergency department with dyspnea times "few weeks associated with clear sputum".  Patient denies any fever.  Patient denies any chest pain but does admit to heart palpitations.  Patient states that symptoms acutely worsened tonight which prompted his visit to the emergency department.  Patient states last cocaine use was January of this year.  Past Medical History:  Diagnosis Date  . Allergy    Nuts  . Hypertension   . New onset atrial flutter (HCC) 02/06/2017    Patient Active Problem List   Diagnosis Date Noted  . New onset of congestive heart failure (HCC) 04/08/2018  . Irritability 03/27/2018  . Cocaine abuse (HCC) 06/10/2017  . 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 Atrial flutter with rapid ventricular response (HCC) 02/06/2017  . Tobacco abuse 02/06/2017  . Hyperlipidemia 12/28/2014  . Hypertension 06/30/2013    Past Surgical History:  Procedure Laterality Date  . KNEE SURGERY    . TONSILLECTOMY      Prior to Admission medications   Medication Sig Start Date End Date Taking? Authorizing Provider  lisinopril (PRINIVIL,ZESTRIL) 20 MG tablet Take 1 tablet (20 mg total) by mouth daily. 03/27/18  Yes Johnson, Megan P, DO  metoprolol tartrate (LOPRESSOR) 25 MG tablet Take 1 tablet (25 mg total) by mouth 2 (two) times daily. 03/27/18  Yes Johnson, Megan P, DO  FLUoxetine (PROZAC) 20 MG capsule Take 1 capsule (20 mg total) by mouth daily. Patient not taking: Reported  on 04/08/2018 03/27/18 03/27/19  Olevia Perches P, DO    Allergies Peanut-containing drug products and Statins  Family History  Problem Relation Age of Onset  . Heart disease Mother   . Diabetes Mother   . Hypertension Father   . Prostate cancer Neg Hx   . Bladder Cancer Neg Hx   . Kidney cancer Neg Hx     Social History Social History   Tobacco Use  . Smoking status: Current Every Day Smoker    Packs/day: 0.50    Types: Cigarettes  . Smokeless tobacco: Never Used  Substance Use Topics  . Alcohol use: No  . Drug use: No    Comment: clean from Crack since 02/07/13.    Review of Systems Constitutional: No fever/chills Eyes: No visual changes. ENT: No sore throat. Cardiovascular: Denies chest pain. Respiratory: Positive for shortness of breath. Gastrointestinal: No abdominal pain.  No nausea, no vomiting.  No diarrhea.  No constipation. Genitourinary: Negative for dysuria. Musculoskeletal: Negative for neck pain.  Negative for back pain. Integumentary: Negative for rash. Neurological: Negative for headaches, focal weakness or numbness.  ____________________________________________   PHYSICAL EXAM:  VITAL SIGNS: ED Triage Vitals  Enc Vitals Group     BP 04/08/18 0249 (!) 178/97     Pulse Rate 04/08/18 0249 (!) 41     Resp 04/08/18 0300 (!) 22     Temp 04/08/18 0249 (!) 97.5 F (36.4 C)     Temp Source 04/08/18 0249 Oral     SpO2 04/08/18 0249 94 %  Weight 04/08/18 0247 90.7 kg (200 lb)     Height 04/08/18 0247 1.88 m (6\' 2" )     Head Circumference --      Peak Flow --      Pain Score --      Pain Loc --      Pain Edu? --      Excl. in GC? --     Constitutional: Alert and oriented.  Apparent respiratory difficulty  eyes: Conjunctivae are normal.  Mouth/Throat: Mucous membranes are moist.  Oropharynx non-erythematous. Neck: No stridor.   Cardiovascular: Bradycardia, irregular rhythm. Good peripheral circulation. Grossly normal heart sounds. Respiratory:  Tachypnea, positive accessory respiratory muscle use, bibasilar rales gastrointestinal: Soft and nontender. No distention.  Musculoskeletal: No lower extremity tenderness nor edema. No gross deformities of extremities. Neurologic:  Normal speech and language. No gross focal neurologic deficits are appreciated.  Skin:  Skin is warm, dry and intact. No rash noted. Psychiatric: Mood and affect are normal. Speech and behavior are normal.  ____________________________________________   LABS (all labs ordered are listed, but only abnormal results are displayed)  Labs Reviewed  BASIC METABOLIC PANEL - Abnormal; Notable for the following components:      Result Value   Glucose, Bld 115 (*)    Calcium 8.7 (*)    All other components within normal limits  CBC - Abnormal; Notable for the following components:   RDW 14.6 (*)    All other components within normal limits  TROPONIN I - Abnormal; Notable for the following components:   Troponin I 0.03 (*)    All other components within normal limits  URINE DRUG SCREEN, QUALITATIVE ([facility] ONLY)   ____________________________________________  EKG  ED ECG REPORT I, [place] N BROWN, the attending physician, personally viewed and interpreted this ECG.   Date: 04/08/2018  EKG Time: 2:55 AM  Rate: 94  Rhythm: Sinus rhythm with multiple PVCs  Axis: Normal  Intervals: Irregular RR interval  ST&T Change: None  ____________________________________________  RADIOLOGY I, [place] N BROWN, personally viewed and evaluated these images (plain radiographs) as part of my medical decision making, as well as reviewing the written report by the radiologist.  ED MD interpretation: Cardiomegaly with interstitial edema per radiologist  Official radiology report(s): Ct Angio Chest Pe W And/or Wo Contrast  Result Date: 04/08/2018 CLINICAL DATA:  62 y/o M; increasing shortness of breath with productive cough. EXAM: CT ANGIOGRAPHY CHEST WITH CONTRAST TECHNIQUE:  Multidetector CT imaging of the chest was performed using the standard protocol during bolus administration of intravenous contrast. Multiplanar CT image reconstructions and MIPs were obtained to evaluate the vascular anatomy. CONTRAST:  75mL OMNIPAQUE IOHEXOL 350 MG/ML SOLN COMPARISON:  None. FINDINGS: Cardiovascular: Mild cardiomegaly. Moderate coronary artery calcific atherosclerosis. Normal caliber thoracic aorta with calcific atherosclerosis. Normal caliber main pulmonary artery. Satisfactory opacification of pulmonary arteries. No pulmonary embolus identified Mediastinum/Nodes: No enlarged mediastinal, hilar, or axillary lymph nodes. Thyroid gland, trachea, and esophagus demonstrate no significant findings. Lungs/Pleura: Several small peripheral pulmonary nodules measuring up to 6 mm at the right lung base (series 11, image 77). Moderate centrilobular emphysema greatest in upper lobes. Smooth interlobular septal thickening, peribronchial thickening, small pleural effusions, and hazy opacities in the lung bases. Upper Abdomen: 6 cm fluid attenuating well-circumscribed lesion in left kidney with thin dense septate. Small hiatal hernia. Musculoskeletal: No chest wall abnormality. No acute or significant osseous findings. Review of the MIP images confirms the above findings. IMPRESSION: 1. Cardiomegaly, interstitial pulmonary edema, small bilateral pleural effusions.  2. No pulmonary embolus identified. 3. Scattered pulmonary nodules measuring up to 6 mm. Non-contrast chest CT at 3-6 months is recommended. If the nodules are stable at time of repeat CT, then future CT at 18-24 months (from today's scan) is considered optional for low-risk patients, but is recommended for high-risk patients. This recommendation follows the consensus statement: Guidelines for Management of Incidental Pulmonary Nodules Detected on CT Images: From the Fleischner Society 2017; Radiology 2017; 284:228-243. 4. Moderate centrilobular  emphysema with upper lobe predominance. 5. Partially visualized mildly complex left kidney cyst. Renal protocol CT or MRI is recommended to further characterize on nonemergent basis. 6. Coronary and aortic calcific atherosclerosis. Electronically Signed   By: Mitzi Hansen M.D.   On: 04/08/2018 04:46   Dg Chest Portable 1 View  Result Date: 04/08/2018 CLINICAL DATA:  Dyspnea EXAM: PORTABLE CHEST 1 VIEW COMPARISON:  02/06/2017 FINDINGS: Stable cardiomegaly with upper lobe predominant emphysema. Fine reticular markings noted of the mid and lower lung bilaterally, likely reflecting chronic interstitial change. No overt pulmonary edema. There is aortic atherosclerosis at the arch without aneurysm. IMPRESSION: Upper lobe predominant emphysematous hyperinflation. Stable cardiomegaly with aortic atherosclerosis. No active pulmonary disease. Electronically Signed   By: Tollie Eth M.D.   On: 04/08/2018 03:58     .Critical Care Performed by: Darci Current, MD Authorized by: Darci Current, MD   Critical care provider statement:    Critical care time (minutes):  30   Critical care time was exclusive of:  Separately billable procedures and treating other patients   Critical care was necessary to treat or prevent imminent or life-threatening deterioration of the following conditions:  Respiratory failure and cardiac failure   Critical care was time spent personally by me on the following activities:  Development of treatment plan with patient or surrogate, discussions with consultants, evaluation of patient's response to treatment, examination of patient, obtaining history from patient or surrogate, ordering and performing treatments and interventions, ordering and review of laboratory studies, ordering and review of radiographic studies, pulse oximetry, re-evaluation of patient's condition and review of old charts     ____________________________________________   INITIAL IMPRESSION /  ASSESSMENT AND PLAN / ED COURSE  As part of my medical decision making, I reviewed the following data within the electronic MEDICAL RECORD NUMBER   61 year old male presenting with above-stated history and physical exam concerning for cardiac etiology of pulmonary edema.  Patient was given multiple DuoNeb's in the emergency department as well as Lasix 40 mg IV with improvement of symptoms.  Patient discussed with Dr. Sheryle Hail for hospital admission for further evaluation and management ____________________________________________  FINAL CLINICAL IMPRESSION(S) / ED DIAGNOSES  Pulmonary edema Elevated troponin  MEDICATIONS GIVEN DURING THIS VISIT:  Medications  ipratropium-albuterol (DUONEB) 0.5-2.5 (3) MG/3ML nebulizer solution 3 mL (3 mLs Nebulization Given 04/08/18 0302)  ipratropium-albuterol (DUONEB) 0.5-2.5 (3) MG/3ML nebulizer solution 3 mL (3 mLs Nebulization Given 04/08/18 0302)  ipratropium-albuterol (DUONEB) 0.5-2.5 (3) MG/3ML nebulizer solution 3 mL (3 mLs Nebulization Given 04/08/18 0301)  methylPREDNISolone sodium succinate (SOLU-MEDROL) 125 mg/2 mL injection 125 mg (125 mg Intravenous Given 04/08/18 0302)  iohexol (OMNIPAQUE) 350 MG/ML injection 75 mL (75 mLs Intravenous Contrast Given 04/08/18 0350)  furosemide (LASIX) injection 40 mg (40 mg Intravenous Given 04/08/18 0529)     ED Discharge Orders    None       Note:  This document was prepared using Dragon voice recognition software and may include unintentional dictation errors.    Manson Passey,  Enedina Finner, MD 04/09/18 2235

## 2018-04-08 NOTE — ED Triage Notes (Signed)
Pt to triage via w/c with no distress noted; st last few wks having increasing SHOB with prod cough clear sputum; denies any recent illness but st performed some sanding in a room without wearing mask; denies any hx of same

## 2018-04-08 NOTE — Progress Notes (Signed)
CCMD called and reported that pt has 8 beats run of V-tach and coughing episode. Doctor Allena Katzatel was notified. Awaiting callback. Will continue to monitor.

## 2018-04-08 NOTE — ED Notes (Signed)
Patient transported to 249 

## 2018-04-08 NOTE — Plan of Care (Signed)
  Problem: Clinical Measurements: Goal: Will remain free from infection Outcome: Progressing   Problem: Pain Managment: Goal: General experience of comfort will improve Outcome: Progressing   Problem: Safety: Goal: Ability to remain free from injury will improve Outcome: Progressing   Problem: Education: Goal: Knowledge of General Education information will improve Outcome: Progressing

## 2018-04-08 NOTE — ED Triage Notes (Signed)
Pt arrived by to ED with sob. Pt states his symptoms started approx 2 weeks ago and he has more difficulty at night when lying down to sleep. Increased work of breathing noted at this time.

## 2018-04-08 NOTE — Progress Notes (Signed)
Nutrition Brief Note  Patient identified on the Malnutrition Screening Tool (MST) Report  [age] y/o male with h/o COPD admitted for new CHF  Wt Readings from Last 15 Encounters:  04/08/18 192 lb 1.6 oz (87.1 kg)  03/27/18 200 lb 4.8 oz (90.9 kg)  06/20/17 177 lb 11.2 oz (80.6 kg)  06/12/17 16 lb (7.258 kg)  06/09/17 160 lb (72.6 kg)  02/07/17 187 lb 6.4 oz (85 kg)  09/11/16 190 lb (86.2 kg)  12/28/14 210 lb (95.3 kg)  04/20/13 200 lb (90.7 kg)    Body mass index is 24.66 kg/m. Patient meets criteria for normal weight based on current BMI.   Current diet order is HH, patient is consuming approximately 100% of meals at this time. Labs and medications reviewed.   No nutrition interventions warranted at this time. If nutrition issues arise, please consult RD.   Betsey Holidayasey Rashanna Christiana MS, RD, LDN Pager #- [phone] After Hours Pager: [phone]

## 2018-04-09 ENCOUNTER — Encounter
Admission: EM | Disposition: A | Discharge status: Home / Self Care | Payer: Self-pay | Source: Home / Self Care | Attending: Emergency Medicine

## 2018-04-09 ENCOUNTER — Telehealth: Payer: Self-pay | Admitting: Internal Medicine

## 2018-04-09 ENCOUNTER — Ambulatory Visit: Payer: Self-pay

## 2018-04-09 ENCOUNTER — Encounter: Payer: Self-pay | Admitting: Cardiovascular Disease

## 2018-04-09 DIAGNOSIS — J9601 Acute respiratory failure with hypoxia: Secondary | ICD-10-CM

## 2018-04-09 DIAGNOSIS — I255 Ischemic cardiomyopathy: Secondary | ICD-10-CM

## 2018-04-09 DIAGNOSIS — J441 Chronic obstructive pulmonary disease with (acute) exacerbation: Secondary | ICD-10-CM

## 2018-04-09 DIAGNOSIS — R748 Abnormal levels of other serum enzymes: Secondary | ICD-10-CM

## 2018-04-09 DIAGNOSIS — J9602 Acute respiratory failure with hypercapnia: Secondary | ICD-10-CM

## 2018-04-09 DIAGNOSIS — I25118 Atherosclerotic heart disease of native coronary artery with other forms of angina pectoris: Secondary | ICD-10-CM

## 2018-04-09 HISTORY — PX: RIGHT/LEFT HEART CATH AND CORONARY ANGIOGRAPHY: CATH118266

## 2018-04-09 LAB — TROPONIN I: Troponin I: 0.03 ng/mL (ref <0.03)

## 2018-04-09 LAB — BASIC METABOLIC PANEL
ANION GAP: 10 (ref 5–15)
BUN: 22 mg/dL — ABNORMAL HIGH (ref 6–20)
CHLORIDE: 100 mmol/L — AB (ref 101–111)
CO2: 26 mmol/L (ref 22–32)
Calcium: 9.2 mg/dL (ref 8.9–10.3)
Creatinine, Ser: 0.97 mg/dL (ref 0.61–1.24)
GFR calc non Af Amer: >60 mL/min (ref >60)
GLUCOSE: 128 mg/dL — AB (ref 65–99)
Potassium: 4.2 mmol/L (ref 3.5–5.1)
Sodium: 136 mmol/L (ref 135–145)

## 2018-04-09 LAB — ECHOCARDIOGRAM COMPLETE
Height: 74 in
Weight: 3073.6 oz

## 2018-04-09 SURGERY — RIGHT/LEFT HEART CATH AND CORONARY ANGIOGRAPHY
Anesthesia: Moderate Sedation

## 2018-04-09 MED ORDER — FENTANYL CITRATE (PF) 100 MCG/2ML IJ SOLN
INTRAMUSCULAR | Status: DC | PRN
  Administered 2018-04-09: 50 ug via INTRAVENOUS

## 2018-04-09 MED ORDER — ASPIRIN 81 MG PO CHEW
CHEWABLE_TABLET | ORAL | Status: AC
  Administered 2018-04-09: 14:00:00
  Filled 2018-04-09: qty 1

## 2018-04-09 MED ORDER — HEPARIN (PORCINE) IN NACL 2-0.9 UNIT/ML-% IJ SOLN
INTRAMUSCULAR | Status: AC
  Filled 2018-04-09: qty 500

## 2018-04-09 MED ORDER — BUDESONIDE-FORMOTEROL FUMARATE 160-4.5 MCG/ACT IN AERO: 2.0000 | INHALATION_SPRAY | Freq: Two times a day (BID) | RESPIRATORY_TRACT | 12 refills | Status: AC

## 2018-04-09 MED ORDER — IPRATROPIUM-ALBUTEROL 20-100 MCG/ACT IN AERS: 1.0000 | INHALATION_SPRAY | Freq: Four times a day (QID) | RESPIRATORY_TRACT | 1 refills | Status: AC | PRN

## 2018-04-09 MED ORDER — SODIUM CHLORIDE 0.9 % WEIGHT BASED INFUSION
3.0000 mL/kg/h | INTRAVENOUS | Status: DC
  Administered 2018-04-09: 3 mL/kg/h via INTRAVENOUS

## 2018-04-09 MED ORDER — ATORVASTATIN CALCIUM 10 MG PO TABS: 10.0000 mg | ORAL_TABLET | Freq: Every day | ORAL | 0 refills | Status: DC

## 2018-04-09 MED ORDER — ISOSORBIDE MONONITRATE ER 30 MG PO TB24
30.0000 mg | ORAL_TABLET | Freq: Every day | ORAL | Status: DC
  Administered 2018-04-09: 30 mg via ORAL
  Filled 2018-04-09: qty 1

## 2018-04-09 MED ORDER — IOPAMIDOL (ISOVUE-300) INJECTION 61%
INTRAVENOUS | Status: DC | PRN
  Administered 2018-04-09: 140 mL via INTRA_ARTERIAL

## 2018-04-09 MED ORDER — ATORVASTATIN CALCIUM 10 MG PO TABS
10.0000 mg | ORAL_TABLET | Freq: Every day | ORAL | Status: DC
  Administered 2018-04-09: 10 mg via ORAL
  Filled 2018-04-09: qty 1

## 2018-04-09 MED ORDER — FUROSEMIDE 20 MG PO TABS: 20.0000 mg | ORAL_TABLET | Freq: Two times a day (BID) | ORAL | 11 refills | Status: DC

## 2018-04-09 MED ORDER — MIDAZOLAM HCL 2 MG/2ML IJ SOLN
INTRAMUSCULAR | Status: AC
  Filled 2018-04-09: qty 2

## 2018-04-09 MED ORDER — LOSARTAN POTASSIUM 100 MG PO TABS: 100.0000 mg | ORAL_TABLET | Freq: Every day | ORAL | 1 refills | Status: DC

## 2018-04-09 MED ORDER — EZETIMIBE 10 MG PO TABS: 10.0000 mg | ORAL_TABLET | Freq: Every day | ORAL | 0 refills | Status: DC

## 2018-04-09 MED ORDER — EZETIMIBE 10 MG PO TABS
10.0000 mg | ORAL_TABLET | Freq: Every day | ORAL | Status: DC
  Administered 2018-04-09: 10 mg via ORAL
  Filled 2018-04-09: qty 1

## 2018-04-09 MED ORDER — CARVEDILOL 6.25 MG PO TABS
6.2500 mg | ORAL_TABLET | Freq: Two times a day (BID) | ORAL | Status: DC
  Administered 2018-04-09: 6.25 mg via ORAL
  Filled 2018-04-09: qty 1

## 2018-04-09 MED ORDER — CARVEDILOL 6.25 MG PO TABS: 6.2500 mg | ORAL_TABLET | Freq: Two times a day (BID) | ORAL | 0 refills | Status: DC

## 2018-04-09 MED ORDER — LOSARTAN POTASSIUM 50 MG PO TABS: 100.0000 mg | ORAL_TABLET | Freq: Every day | ORAL | Status: DC

## 2018-04-09 MED ORDER — ASPIRIN EC 325 MG PO TBEC: 325.0000 mg | DELAYED_RELEASE_TABLET | Freq: Every day | ORAL | 3 refills | Status: DC

## 2018-04-09 MED ORDER — NICOTINE 21 MG/24HR TD PT24: 21.0000 mg | MEDICATED_PATCH | Freq: Every day | TRANSDERMAL | 0 refills | Status: DC

## 2018-04-09 MED ORDER — ASPIRIN 81 MG PO CHEW
81.0000 mg | CHEWABLE_TABLET | ORAL | Status: AC
  Administered 2018-04-09: 81 mg via ORAL

## 2018-04-09 MED ORDER — FENTANYL CITRATE (PF) 100 MCG/2ML IJ SOLN
INTRAMUSCULAR | Status: AC
  Filled 2018-04-09: qty 2

## 2018-04-09 MED ORDER — SODIUM CHLORIDE 0.9 % WEIGHT BASED INFUSION
1.0000 mL/kg/h | INTRAVENOUS | Status: DC
  Administered 2018-04-09: 1 mL/kg/h via INTRAVENOUS

## 2018-04-09 MED ORDER — ISOSORBIDE MONONITRATE ER 30 MG PO TB24: 30.0000 mg | ORAL_TABLET | Freq: Every day | ORAL | 1 refills | Status: DC

## 2018-04-09 MED ORDER — MIDAZOLAM HCL 2 MG/2ML IJ SOLN
INTRAMUSCULAR | Status: DC | PRN
  Administered 2018-04-09: 1.5 mg via INTRAVENOUS

## 2018-04-09 SURGICAL SUPPLY — 12 items
CATH INFINITI 5FR ANG PIGTAIL (CATHETERS) ×3 IMPLANT
CATH INFINITI 5FR JL4 (CATHETERS) ×3 IMPLANT
CATH INFINITI JR4 5F (CATHETERS) ×3 IMPLANT
CATH SWANZ 7F THERMO (CATHETERS) ×3 IMPLANT
DEVICE CLOSURE MYNXGRIP 5F (Vascular Products) ×3 IMPLANT
KIT MANI 3VAL PERCEP (MISCELLANEOUS) ×3 IMPLANT
KIT RIGHT HEART (MISCELLANEOUS) ×3 IMPLANT
NEEDLE PERC 18GX7CM (NEEDLE) ×3 IMPLANT
PACK CARDIAC CATH (CUSTOM PROCEDURE TRAY) ×3 IMPLANT
SHEATH AVANTI 5FR X 11CM (SHEATH) ×3 IMPLANT
SHEATH PINNACLE 7F 10CM (SHEATH) ×3 IMPLANT
WIRE GUIDERIGHT .035X150 (WIRE) ×3 IMPLANT

## 2018-04-09 NOTE — Progress Notes (Signed)
Cardiac catheterization results  ---Severe single vessel disease, occluded proximal RCA with collaterals from left to right ---Severe distal LCX disease (would be very difficult to PCI given diffuse disease, distal nature of leasion, also at bifurcation/takeoff of PDA ---moderate proximal LAD, proximal LCX and diagonal disease : would manage medically  Will retry statins/zetia Increase coreg as bp tolerates. Rx of COPD ARB Add isosorbide 30 daily  meds through medical management (missed his appt today)  Signed, Dossie Arbourim Gollan, MD, Ph.D [facility]

## 2018-04-09 NOTE — Progress Notes (Signed)
Ivs and tele removed from patient. Discharge instructions given to patient along with hard copy prescriptions. Verbalized understanding. No distress at this time. Patient awaiting ride.

## 2018-04-09 NOTE — Discharge Summary (Signed)
[facility] [place] [facility]  Ty HiltsDavid J Winters, 62 y.o., DOB 1956/01/27, MRN [id number]. Admission date: 04/08/2018 Discharge Date 04/09/2018 Primary MD Patient, No Pcp Per Admitting Physician Arnaldo NatalMichael S Diamond, MD  Admission Diagnosis  Trouble breathing  Discharge Diagnosis   Active Problems: Acute on chronic systolic CHF Likely COPD with possible exasperation Coronary artery disease Hyperlipidemia Nicotine abuse     Hospital Course  Patient is a 62 year old white male with history of nicotine abuse presented with shortness of breath.  Patient was noted to have acute CHF.  Patient also likely had findings of COPD.  He was treated with Lasix and treated for COPD.  His symptoms improved.  He underwent a cardiac cath which showed occlusive coronary artery disease that was not amendable to intervention.  Cardiology recommended medical management.  He will need outpatient follow-up with cardiology for further treatment options.  To have him be followed up with pulmonary for PFTs.         Consults  cardiology  Significant Tests:  See full reports for all details     Ct Angio Chest Pe W And/or Wo Contrast  Result Date: 04/08/2018 CLINICAL DATA:  62 y/o M; increasing shortness of breath with productive cough. EXAM: CT ANGIOGRAPHY CHEST WITH CONTRAST TECHNIQUE: Multidetector CT imaging of the chest was performed using the standard protocol during bolus administration of intravenous contrast. Multiplanar CT image reconstructions and MIPs were obtained to evaluate the vascular anatomy. CONTRAST:  75mL OMNIPAQUE IOHEXOL 350 MG/ML SOLN COMPARISON:  None. FINDINGS: Cardiovascular: Mild cardiomegaly. Moderate coronary artery calcific atherosclerosis. Normal caliber thoracic aorta with calcific atherosclerosis. Normal caliber main pulmonary artery. Satisfactory opacification of pulmonary arteries. No pulmonary embolus identified Mediastinum/Nodes: No enlarged mediastinal,  hilar, or axillary lymph nodes. Thyroid gland, trachea, and esophagus demonstrate no significant findings. Lungs/Pleura: Several small peripheral pulmonary nodules measuring up to 6 mm at the right lung base (series 11, image 77). Moderate centrilobular emphysema greatest in upper lobes. Smooth interlobular septal thickening, peribronchial thickening, small pleural effusions, and hazy opacities in the lung bases. Upper Abdomen: 6 cm fluid attenuating well-circumscribed lesion in left kidney with thin dense septate. Small hiatal hernia. Musculoskeletal: No chest wall abnormality. No acute or significant osseous findings. Review of the MIP images confirms the above findings. IMPRESSION: 1. Cardiomegaly, interstitial pulmonary edema, small bilateral pleural effusions. 2. No pulmonary embolus identified. 3. Scattered pulmonary nodules measuring up to 6 mm. Non-contrast chest CT at 3-6 months is recommended. If the nodules are stable at time of repeat CT, then future CT at 18-24 months (from today's scan) is considered optional for low-risk patients, but is recommended for high-risk patients. This recommendation follows the consensus statement: Guidelines for Management of Incidental Pulmonary Nodules Detected on CT Images: From the Fleischner Society 2017; Radiology 2017; 284:228-243. 4. Moderate centrilobular emphysema with upper lobe predominance. 5. Partially visualized mildly complex left kidney cyst. Renal protocol CT or MRI is recommended to further characterize on nonemergent basis. 6. Coronary and aortic calcific atherosclerosis. Electronically Signed   By: Mitzi HansenLance  Furusawa-Stratton M.D.   On: 04/08/2018 04:46   Dg Chest Portable 1 View  Result Date: 04/08/2018 CLINICAL DATA:  Dyspnea EXAM: PORTABLE CHEST 1 VIEW COMPARISON:  02/06/2017 FINDINGS: Stable cardiomegaly with upper lobe predominant emphysema. Fine reticular markings noted of the mid and lower lung bilaterally, likely reflecting chronic interstitial  change. No overt pulmonary edema. There is aortic atherosclerosis at the arch without aneurysm. IMPRESSION: Upper lobe predominant emphysematous hyperinflation. Stable cardiomegaly with  aortic atherosclerosis. No active pulmonary disease. Electronically Signed   By: Tollie Eth M.D.   On: 04/08/2018 03:58       Today   Subjective:   Alex Winters patient doing much better breathing improved Objective:   Blood pressure (!) 143/93, pulse 69, temperature 98.1 F (36.7 C), temperature source Oral, resp. rate 18, height 6\' 2"  (1.88 m), weight 88 kg (194 lb), SpO2 99 %.  .  Intake/Output Summary (Last 24 hours) at 04/09/2018 1451 Last data filed at 04/09/2018 0823 Gross per 24 hour  Intake -  Output 1150 ml  Net -1150 ml    Exam VITAL SIGNS: Blood pressure (!) 143/93, pulse 69, temperature 98.1 F (36.7 C), temperature source Oral, resp. rate 18, height 6\' 2"  (1.88 m), weight 88 kg (194 lb), SpO2 99 %.  GENERAL:  62 y.o.-year-old patient lying in the bed with no acute distress.  EYES: Pupils equal, round, reactive to light and accommodation. No scleral icterus. Extraocular muscles intact.  HEENT: Head atraumatic, normocephalic. Oropharynx and nasopharynx clear.  NECK:  Supple, no jugular venous distention. No thyroid enlargement, no tenderness.  LUNGS: Normal breath sounds bilaterally, no wheezing, rales,rhonchi or crepitation. No use of accessory muscles of respiration.  CARDIOVASCULAR: S1, S2 normal. No murmurs, rubs, or gallops.  ABDOMEN: Soft, nontender, nondistended. Bowel sounds present. No organomegaly or mass.  EXTREMITIES: No pedal edema, cyanosis, or clubbing.  NEUROLOGIC: Cranial nerves II through XII are intact. Muscle strength 5/5 in all extremities. Sensation intact. Gait not checked.  PSYCHIATRIC: The patient is alert and oriented x 3.  SKIN: No obvious rash, lesion, or ulcer.   Data Review     CBC w Diff:  Lab Results  Component Value Date   WBC 8.5 04/08/2018    HGB 16.4 04/08/2018   HGB 16.4 03/27/2018   HCT 48.8 04/08/2018   HCT 47.5 03/27/2018   PLT 212 04/08/2018   PLT 190 03/27/2018   LYMPHOPCT 18 06/12/2017   MONOPCT 10 06/12/2017   EOSPCT 1 06/12/2017   BASOPCT 1 06/12/2017   CMP:  Lab Results  Component Value Date   NA 136 04/09/2018   NA 140 03/27/2018   NA 130 (L) 03/16/2014   K 4.2 04/09/2018   K 4.2 03/16/2014   CL 100 (L) 04/09/2018   CL 100 03/16/2014   CO2 26 04/09/2018   CO2 26 03/16/2014   BUN 22 (H) 04/09/2018   BUN 10 03/27/2018   BUN 17 03/16/2014   CREATININE 0.97 04/09/2018   CREATININE 1.03 03/16/2014   GLU 91 12/21/2014   PROT 7.1 03/27/2018   PROT 7.7 03/16/2014   ALBUMIN 4.4 03/27/2018   ALBUMIN 3.7 03/16/2014   BILITOT 0.4 03/27/2018   BILITOT 0.4 03/16/2014   ALKPHOS 97 03/27/2018   ALKPHOS 106 03/16/2014   AST 16 03/27/2018   AST 20 03/16/2014   ALT 8 03/27/2018   ALT 15 03/16/2014  .  Micro Results No results found for this or any previous visit (from the past 240 hour(s)).      Code Status Orders  (From admission, onward)        Start     Ordered   04/08/18 0650  Full code  Continuous     04/08/18 0649    Code Status History    Date Active Date Inactive Code Status Order ID Comments User Context   02/06/2017 1204 02/07/2017 1426 Full Code [id number]  Katha Hamming, MD ED  Follow-up Information    Antonieta Iba, MD Follow up in 2 week(s).   Specialty:  Cardiology Contact information: [address] [place] [address] [phone]        [facility] Follow up in 1 week(s).   Specialty:  Primary Care Why:  hosp f/u Contact information: [address] [place] [address] [phone]          Discharge Medications   Allergies as of 04/09/2018      Reactions   Peanut-containing Drug Products Other (See Comments)   Abdominal discomfort   Statins Other (See Comments)    Myalgias, Zocor, lipitor Reaction: joint pain       Medication List    STOP taking these medications   lisinopril 20 MG tablet Commonly known as:  PRINIVIL,ZESTRIL   metoprolol tartrate 25 MG tablet Commonly known as:  LOPRESSOR     TAKE these medications   aspirin EC 325 MG tablet Take 1 tablet (325 mg total) by mouth daily.   atorvastatin 10 MG tablet Commonly known as:  LIPITOR Take 1 tablet (10 mg total) by mouth daily at 6 PM.   budesonide-formoterol 160-4.5 MCG/ACT inhaler Commonly known as:  SYMBICORT Inhale 2 puffs into the lungs 2 (two) times daily.   carvedilol 6.25 MG tablet Commonly known as:  COREG Take 1 tablet (6.25 mg total) by mouth 2 (two) times daily with a meal.   ezetimibe 10 MG tablet Commonly known as:  ZETIA Take 1 tablet (10 mg total) by mouth daily. Start taking on:  04/10/2018   FLUoxetine 20 MG capsule Commonly known as:  PROZAC Take 1 capsule (20 mg total) by mouth daily.   Ipratropium-Albuterol 20-100 MCG/ACT Aers respimat Commonly known as:  COMBIVENT RESPIMAT Inhale 1 puff into the lungs every 6 (six) hours as needed for wheezing.   isosorbide mononitrate 30 MG 24 hr tablet Commonly known as:  IMDUR Take 1 tablet (30 mg total) by mouth daily. Start taking on:  04/10/2018   losartan 100 MG tablet Commonly known as:  COZAAR Take 1 tablet (100 mg total) by mouth daily. Start taking on:  04/10/2018   nicotine 21 mg/24hr patch Commonly known as:  NICODERM CQ - dosed in mg/24 hours Place 1 patch (21 mg total) onto the skin daily. Start taking on:  04/10/2018          Total Time in preparing paper work, data evaluation and todays exam - 35 minutes  Auburn Bilberry M.D on 04/09/2018 at 2:51 PM [facility]   Office  [phone]

## 2018-04-09 NOTE — Care Management Note (Addendum)
Case Management Note  Patient Details  Name: Alex Winters MRN: [id number] Date of Birth: 1956/04/22  Subjective/Objective:   Admitted to [facility] with the diagnosis of new onset of congestive heart failure. Melene Plan. Goes to the [facility] for physician care. Next appointment 04/17/18. Seen Dr. Letitia LibraJohnston 03/27/18. She  Discontinued the Eliquis at that appointment. Appointment is scheduled for today 04/09/18 for Medication Mgt at 2:00pm. Gets help with medications at the [facility]. Friend is Almira CoasterSusan Friis [phone]).  Application given 02/07/18 for patient assistance for Eliquis. Also, coupon for 30 days free Eliquis given.                  Action/Plan: May need home nebulizer per Dr. Allena KatzPatel.   Expected Discharge Date:                  Expected Discharge Plan:     In-House Referral:     Discharge planning Services     Post Acute Care Choice:    Choice offered to:     DME Arranged:    DME Agency:     HH Arranged:    HH Agency:     Status of Service:     If discussed at [employer] of [employer] Meetings, dates discussed:    Additional Comments:  Gwenette GreetBrenda S Xayvion Shirah, RN MSN CCM Care Management [phone] 04/09/2018, 9:21 AM

## 2018-04-09 NOTE — Consult Note (Signed)
Provided patient with "Living Better with Heart Failure" packet. Briefly reviewed definition of heart failure and signs and symptoms of an exacerbation.  Fluid restriction <2L/day Reviewed medication changes and side effects  Discussed tobacco cessation: pt provided patch- discussed other methods-replacing with healthy habit Pt concerned about multiple dr appointments and lack of money. He says he doesn't want to go and then not pay his bill Explained the roll of the HF clinic to him Encouraged him to call HF clinic to discuss finanses, but that there would be no payment up front encouraged him to atleast go to cardiology appointment as they can address all of his cardiac issues  Olene FlossMelissa D Alphonsa Brickle, Pharm.D, BCPS Clinical Pharmacist

## 2018-04-09 NOTE — Telephone Encounter (Signed)
Patient currently admitted

## 2018-04-09 NOTE — Telephone Encounter (Signed)
TCM ph [facility] for sob s/p cath needs 2 weeks fu   End patient not since last hospital admission did not fu for tcm   Scheduled 4/23 at 130 pm With Brion AlimentBerge

## 2018-04-09 NOTE — Progress Notes (Signed)
Progress Note  Patient Name: Alex Winters Date of Encounter: 04/09/2018  Primary Cardiologist: [facility]  Subjective   Shortness of breath this morning, worse after getting back from the bathroom Able to lay flat if not moving Somewhat better compared to yesterday N.p.o. this morning for cardiac catheterization "Want to find out what is going on" Discussed long history of polysubstance abuse, crack cocaine, smoking  CT scan chest results reviewed with him in detail  Inpatient Medications    Scheduled Meds: . [START ON 04/10/2018] aspirin  81 mg Oral Pre-Cath  . carvedilol  3.125 mg Oral BID WC  . docusate sodium  100 mg Oral BID  . enoxaparin (LOVENOX) injection  40 mg Subcutaneous Q24H  . fluticasone  2 spray Each Nare Daily  . guaiFENesin  600 mg Oral BID  . ipratropium-albuterol  3 mL Nebulization Q4H  . lisinopril  20 mg Oral Daily  . methylPREDNISolone (SOLU-MEDROL) injection  60 mg Intravenous Q12H  . nicotine  21 mg Transdermal Daily   Continuous Infusions: . [START ON 04/10/2018] sodium chloride     Followed by  . [START ON 04/10/2018] sodium chloride     PRN Meds: acetaminophen **OR** acetaminophen, guaiFENesin-dextromethorphan, ondansetron **OR** ondansetron (ZOFRAN) IV   Vital Signs    Vitals:   04/08/18 2129 04/08/18 2342 04/09/18 0342 04/09/18 0807  BP:   110/68 (!) 145/93  Pulse:   70 78  Resp:   18 18  Temp:   98.4 F (36.9 C) 98.2 F (36.8 C)  TempSrc:    Oral  SpO2: 96% 97% 96% 98%  Weight:   194 lb 6.4 oz (88.2 kg)   Height:        Intake/Output Summary (Last 24 hours) at 04/09/2018 0833 Last data filed at 04/09/2018 1610 Gross per 24 hour  Intake 840 ml  Output 1975 ml  Net -1135 ml   Filed Weights   04/08/18 0247 04/08/18 0647 04/09/18 0342  Weight: 200 lb (90.7 kg) 192 lb 1.6 oz (87.1 kg) 194 lb 6.4 oz (88.2 kg)    Telemetry    NSR with PVC/NSVT - Personally Reviewed  ECG     Physical Exam   Constitutional:  oriented  to person, place, and time. No distress.  HENT:  Head: Normocephalic and atraumatic.  Eyes:  no discharge. No scleral icterus.  Neck: Normal range of motion. Neck supple. No JVD present.  Cardiovascular: Normal rate, regular rhythm, normal heart sounds and intact distal pulses. Exam reveals no gallop and no friction rub. No edema No murmur heard. Pulmonary/Chest: Moderately decreased breath sounds throughout , no stridor. No respiratory distress.  no wheezes.  scattered rales.  no tenderness.  Abdominal: Soft.  no distension.  no tenderness.  Musculoskeletal: Normal range of motion.  no  tenderness or deformity.  Neurological:  normal muscle tone. Coordination normal. No atrophy Skin: Skin is warm and dry. No rash noted. not diaphoretic.  Psychiatric:  normal mood and affect. behavior is normal. Thought content normal.      Labs    Chemistry Recent Labs  Lab 04/08/18 0300  NA 137  K 4.3  CL 103  CO2 28  GLUCOSE 115*  BUN 16  CREATININE 0.81  CALCIUM 8.7*  GFRNONAA >60  GFRAA >60  ANIONGAP 6     Hematology Recent Labs  Lab 04/08/18 0300  WBC 8.5  RBC 5.47  HGB 16.4  HCT 48.8  MCV 89.1  MCH 29.9  MCHC 33.6  RDW 14.6*  PLT 212    Cardiac Enzymes Recent Labs  Lab 04/08/18 0300  TROPONINI 0.03*   No results for input(s): TROPIPOC in the last 168 hours.   BNPNo results for input(s): BNP, PROBNP in the last 168 hours.   DDimer No results for input(s): DDIMER in the last 168 hours.   Radiology    Ct Angio Chest Pe W And/or Wo Contrast  Result Date: 04/08/2018 CLINICAL DATA:  62 y/o M; increasing shortness of breath with productive cough. EXAM: CT ANGIOGRAPHY CHEST WITH CONTRAST TECHNIQUE: Multidetector CT imaging of the chest was performed using the standard protocol during bolus administration of intravenous contrast. Multiplanar CT image reconstructions and MIPs were obtained to evaluate the vascular anatomy. CONTRAST:  75mL OMNIPAQUE IOHEXOL 350 MG/ML SOLN  COMPARISON:  None. FINDINGS: Cardiovascular: Mild cardiomegaly. Moderate coronary artery calcific atherosclerosis. Normal caliber thoracic aorta with calcific atherosclerosis. Normal caliber main pulmonary artery. Satisfactory opacification of pulmonary arteries. No pulmonary embolus identified Mediastinum/Nodes: No enlarged mediastinal, hilar, or axillary lymph nodes. Thyroid gland, trachea, and esophagus demonstrate no significant findings. Lungs/Pleura: Several small peripheral pulmonary nodules measuring up to 6 mm at the right lung base (series 11, image 77). Moderate centrilobular emphysema greatest in upper lobes. Smooth interlobular septal thickening, peribronchial thickening, small pleural effusions, and hazy opacities in the lung bases. Upper Abdomen: 6 cm fluid attenuating well-circumscribed lesion in left kidney with thin dense septate. Small hiatal hernia. Musculoskeletal: No chest wall abnormality. No acute or significant osseous findings. Review of the MIP images confirms the above findings. IMPRESSION: 1. Cardiomegaly, interstitial pulmonary edema, small bilateral pleural effusions. 2. No pulmonary embolus identified. 3. Scattered pulmonary nodules measuring up to 6 mm. Non-contrast chest CT at 3-6 months is recommended. If the nodules are stable at time of repeat CT, then future CT at 18-24 months (from today's scan) is considered optional for low-risk patients, but is recommended for high-risk patients. This recommendation follows the consensus statement: Guidelines for Management of Incidental Pulmonary Nodules Detected on CT Images: From the Fleischner Society 2017; Radiology 2017; 284:228-243. 4. Moderate centrilobular emphysema with upper lobe predominance. 5. Partially visualized mildly complex left kidney cyst. Renal protocol CT or MRI is recommended to further characterize on nonemergent basis. 6. Coronary and aortic calcific atherosclerosis. Electronically Signed   By: Mitzi Hansen M.D.   On: 04/08/2018 04:46   Dg Chest Portable 1 View  Result Date: 04/08/2018 CLINICAL DATA:  Dyspnea EXAM: PORTABLE CHEST 1 VIEW COMPARISON:  02/06/2017 FINDINGS: Stable cardiomegaly with upper lobe predominant emphysema. Fine reticular markings noted of the mid and lower lung bilaterally, likely reflecting chronic interstitial change. No overt pulmonary edema. There is aortic atherosclerosis at the arch without aneurysm. IMPRESSION: Upper lobe predominant emphysematous hyperinflation. Stable cardiomegaly with aortic atherosclerosis. No active pulmonary disease. Electronically Signed   By: Tollie Eth M.D.   On: 04/08/2018 03:58    Cardiac Studies   Echo pending  Patient Profile     Alex Winters is a 62 year old man with chronic diastolic heart failure, atrial flutter, hypertension, and polysubstance abuse,atrial flutter, who presents for progressive shortness of breath, NSVT, frequent PVC   Assessment & Plan     1.  Acute on chronic diastolic CHF: -Echocardiogram pending -Coreg and lisinopril  2.  stable angina -Initial troponin mildly elevated at 0.03,  Repeat tnt ordered this AM  cardiac catheterization given SOB/tightness in chest concerning for angina, NSVT and heavy coronary calcium on CTA/three vessel (personallty reviewed) I have reviewed the risks, indications, and  alternatives to cardiac catheterization, possible angioplasty, and stenting with the patient. Risks include but are not limited to bleeding, infection, vascular injury, stroke, myocardial infection, arrhythmia, kidney injury, radiation-related injury in the case of prolonged fluoroscopy use, emergency cardiac surgery, and death. The patient understands the risks of serious complication is 1-2 in 1000 with diagnostic cardiac cath and 1-2% or less with angioplasty/stenting.  Cath this AM if BMP shows stable renal function  3.  Frequent PVCs/NSVT: -Concerning for ischemia -Magnesium and potassium  at goal right and left cardiac catheterization on 4/10  4.  Atrial flutter:  sinus rhythm  not been on oral anticoagulation -CHADS2VASc at least 3 (CHF, HTN, vascular disease)  5.  Polysubstance abuse: Hx of cocaine/smoking -Urine drug screen negative -Tobacco cessation advised  6.  COPD exacerbation: On CT scan  7.  Hypertension: -Continue medications as above  8.  Hyperlipidemia:  reports he is intolerant to statins secondary to myalgias Cath today  Will discuss with him again Add zetia   Total encounter time more than 45 minutes  Greater than 50% was spent in counseling and coordination of care with the patient   For questions or updates, please contact [facility] Please consult [email or web address] for contact info under Cardiology/STEMI.      Signed, Julien Nordmannimothy Gollan, MD  04/09/2018, 8:33 AM

## 2018-04-10 ENCOUNTER — Ambulatory Visit: Payer: Self-pay

## 2018-04-10 ENCOUNTER — Ambulatory Visit: Payer: Self-pay | Admitting: Ophthalmology

## 2018-04-10 ENCOUNTER — Ambulatory Visit: Payer: Self-pay | Admitting: Licensed Clinical Social Worker

## 2018-04-10 NOTE — Telephone Encounter (Signed)
No answer. Left message to call back.   

## 2018-04-15 ENCOUNTER — Ambulatory Visit: Payer: Self-pay | Admitting: Licensed Clinical Social Worker

## 2018-04-15 DIAGNOSIS — F411 Generalized anxiety disorder: Secondary | ICD-10-CM

## 2018-04-15 DIAGNOSIS — F331 Major depressive disorder, recurrent, moderate: Secondary | ICD-10-CM

## 2018-04-15 NOTE — Progress Notes (Signed)
Total time:30 minutes Type of Service: Integrated Behavioral Health in clinic Interpretor:No.   SUBJECTIVE: Alex Winters is a 62 y.o. male  referred by a provider at [facility] for symptoms of:  anxiety, depression, irritability, mood swings, sleep disturbance and tobacco use. Patient is accompanied by himself.  Patient reports the following symptoms and or concerns: anxiety, depression and anger/angry outbursts.:  Duration of problem:  [name] reports that he has been dealing with depression, the majority of his life. He describes having symptoms a few times of week of anger, angry outbursts, irritability, difficulty sleeping, sadness, grieving the loss of his true love, isolating himself away from others, and feeling bad about himself. He describes having symptoms of anxiety that have been occurring since 2009 as evidenced by feeling nervous, anxious or on edge, trouble relaxing, difficulty sleeping, feeling restless that it's hard to sit still, being easily annoyed or irritable, and worrying to much about different things. Impact on function: He notes that one of the main causes of his depression is unemployment, having to stay with a friend, and being diagnosed with congestive heart failure in the last month. He explains that his anger has been a deterrent in relationships.  Current or Hx of substance use: He notes that he is in recovery from Cocaine. He reports that he used on and off since he was a teenager, used to use until he ran out of money, and last use was three months ago. 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 He notes that the last DWI was in 1988. He notes that he has been arrested for possession of cocaine and two larceny charges in the past. He notes that he attends NA and works the [employer].  PSYCHIATRIC HISTORY - Medical conditions that might explain or contribute to symptoms: He has been diagnosed with Congestive Heart  Failure, Hyperlipidemia, his a smoker, and hypertension.  - Hospitalizations/ Outpatient therapy:  He notes that he has been in and out of Substance Abuse treatment. He notes that he was previously a resident at [facility] in the last year. He notes that he has been to [facility] since the 90's. He reports that he has been a client at [facility] since last year receiving anger management group therapy and medication management from Dr. Bard HerbertMoffit. He notes that he is currently prescribed Prozac 20 mg that helps with his anxiety and depression. He reports that the provider he saw last here told him that she would be able to prescribe his medications.  -Pharmacotherapy:Prozac 20 mg daily for symptoms of anxiety and depression prescribed by [facility].  -Family history of psychiatric issues: He denies a history of mental illness. He reports that is a history of alcoholism in the family.      LIFE CONTEXT:  Family & Social:,patient lives with friend.  He is the youngest of four siblings, two are deceased. He reports that he was estranged from his brother for a long time and recently reconnected.   Currently employed:No. He reports that he does some side work but would like to have steady employment.    What is the last grade of school you completed? GED and some college but did not graduate.  Are you active with community agencies/resources/homecare? Yes Agency Name: He is a patient at [facility].  church, synagogue, mosque or other faith based community? No  clubs or social organizations? He attends  NA meetings in the community. What do you do for fun?  Hobbies?  Interests? He notes that he prefers working and keeping busy.  Recent Life changes: He reports that his health declining in the last year has been the biggest change and being diagnosed with congestive heart failure in the last month.   GOALS ADDRESSED:  Patient will reduce symptoms of: agitation, anxiety and depression; increase  ability ZO:XWRUEA skills, healthy habits, self-management skills and stress reduction, will also :Increase healthy adjustment to current life circumstances.  INTERVENTIONS:Biopsychosocial Assessment, Psychoeducation and/or Health Education and Link to [employer], Copywriter, advertising , Reflective listening Standardized Assessments completed: PHQ 9=16,indication of: moderate depression. GAD-7=12 indication of: moderate anxiety.   ISSUES DISCUSSED: Integrated care services, support system, previous and current coping skills, community resources , community support, things patient enjoy or use to enjoy doing, grief, anxiety, depression, unemployment, substance abuse, anger, and health problems.     ASSESSMENT:  Patient currently experiencing symptoms of  Anxiety, depression, and anger.  Symptoms exacerbated by grief, unemployment, health problems.  Patient may benefit from, and is in agreement to receive further assessment and brief therapeutic interventions to assist with managing symptoms.   PLAN: . Patient will F/U with Carey Bullocks, LCSW . LCSW will F/U with phone call  . Behavioral recommendations: Continue to follow up with [facility] for medication management.  . Referral:Integrated Behavioral Health Services (In Clinic) and [facility] Mental [facility] Services (LME/Outside Clinic), and follow up with [facility] to see the psychiatrist to get back on his mental health medications. . :   Warm Hand Off Completed.

## 2018-04-15 NOTE — Telephone Encounter (Signed)
No answer. Left message to call back.   

## 2018-04-16 ENCOUNTER — Ambulatory Visit: Payer: Self-pay

## 2018-04-16 NOTE — Telephone Encounter (Signed)
Patient contacted regarding discharge from [facility] on 04/09/18.   Patient understands to follow up with provider ? On 04/22/18 1:30pm at [place].  Patient understands discharge instructions? Yes  Patient understands medications and regiment? Yes   Patient understands to bring all medications to this visit? Yes

## 2018-04-17 ENCOUNTER — Ambulatory Visit: Payer: Medicaid Other | Admitting: Adult Health Nurse Practitioner

## 2018-04-17 VITALS — BP 131/77 | HR 77 | Temp 97.7°F | Resp 18 | Wt 197.2 lb

## 2018-04-17 DIAGNOSIS — E785 Hyperlipidemia, unspecified: Secondary | ICD-10-CM

## 2018-04-17 DIAGNOSIS — I509 Heart failure, unspecified: Secondary | ICD-10-CM

## 2018-04-17 DIAGNOSIS — I10 Essential (primary) hypertension: Secondary | ICD-10-CM

## 2018-04-17 MED ORDER — FLUTICASONE PROPIONATE 50 MCG/ACT NA SUSP: 2.0000 | Freq: Every day | NASAL | 2 refills | Status: AC

## 2018-04-17 NOTE — Progress Notes (Signed)
Patient: Alex Winters Male    DOB: [birth year]   62 y.o.   MRN: [id number] Visit Date: 04/17/2018  Today's Provider: Jacelyn Pi, NP   Chief Complaint  Patient presents with  . Follow-up    Recent hospitlization for CHF   Subjective:    HPI  HTN:  Last visit started on Lisinopril 62m QD and Metoprolol 25mg  BID. These medications were stopped in the hospital. Currently on Losartan 100mg  daily, Imdur 30mg  daily and Coreg 6.25 BID.   Referred to [facility] for behavioral health needs.  Saw Heather for therapy, will FU with [facility] with Prozac Rx.   Was recently hospitalized 4.9-4.10.19 for acute CHF and COPD.  Diuresed with Lasix, started Symbicort and Combivent respimat  for COPD management.  States that breathing is somewhat improved, has not smoked since hospitalization. States that he is doing light exercise and is not getting winded.  Denies any fluid retention.   Cardiac cath showed occlusive CAD- no intervention, Cardiology recommended medical management.  EKG showed NSR with multiple PVCs.  EF 45-50%  Continues on lipitor 10mg  daily for HLD.   C/o runny nose when the allergy season has started, clear drainage.      Allergies  Allergen Reactions  . Peanut-Containing Drug Products Other (See Comments)    Abdominal discomfort  . Statins Other (See Comments)    Myalgias, Zocor, lipitor Reaction: joint pain    Previous Medications   ASPIRIN EC 325 MG TABLET    Take 1 tablet (325 mg total) by mouth daily.   ATORVASTATIN (LIPITOR) 10 MG TABLET    Take 1 tablet (10 mg total) by mouth daily at 6 PM.   BUDESONIDE-FORMOTEROL (SYMBICORT) 160-4.5 MCG/ACT INHALER    Inhale 2 puffs into the lungs 2 (two) times daily.   CARVEDILOL (COREG) 6.25 MG TABLET    Take 1 tablet (6.25 mg total) by mouth 2 (two) times daily with a meal.   EZETIMIBE (ZETIA) 10 MG TABLET    Take 1 tablet (10 mg total) by mouth daily.   FLUOXETINE (PROZAC) 20 MG CAPSULE    Take 1 capsule (20 mg total) by mouth  daily.   FUROSEMIDE (LASIX) 20 MG TABLET    Take 1 tablet (20 mg total) by mouth 2 (two) times daily.   IPRATROPIUM-ALBUTEROL (COMBIVENT RESPIMAT) 20-100 MCG/ACT AERS RESPIMAT    Inhale 1 puff into the lungs every 6 (six) hours as needed for wheezing.   ISOSORBIDE MONONITRATE (IMDUR) 30 MG 24 HR TABLET    Take 1 tablet (30 mg total) by mouth daily.   LOSARTAN (COZAAR) 100 MG TABLET    Take 1 tablet (100 mg total) by mouth daily.   NICOTINE (NICODERM CQ - DOSED IN MG/24 HOURS) 21 MG/24HR PATCH    Place 1 patch (21 mg total) onto the skin daily.    Review of Systems  All other systems reviewed and are negative.   Social History   Tobacco Use  . Smoking status: Former Smoker    Packs/day: 1.00    Years: 50.00    Pack years: 50.00    Types: Cigarettes    Last attempt to quit: 04/07/2018    Years since quitting: 0.0  . Smokeless tobacco: Never Used  Substance Use Topics  . Alcohol use: Not Currently    Comment: Quit 1992   Objective:   BP 131/77   Pulse 77   Temp 97.7 F (36.5 C)   Resp 18   Wt 197 lb 3.2 oz (  89.4 kg)   BMI 25.32 kg/m   Physical Exam  Constitutional: He appears well-developed and well-nourished.  HENT:  Head: Normocephalic and atraumatic.  Cardiovascular: Normal rate, regular rhythm, normal heart sounds and intact distal pulses.  Pulmonary/Chest: Effort normal and breath sounds normal.  Abdominal: Soft. Bowel sounds are normal.  Musculoskeletal: He exhibits no edema.  Neurological: He is alert.  Skin: Skin is warm and dry.        Assessment & Plan:     HLD:   Continue current regimen.  Encourage low cholesterol, low fat diet and exercise.   Post-Hospital CBC, CMET  CHF:  Continue current medication regimen.  Monitor for fluid overload or dyspnea.  Continue to weigh daily- given weight precautions and when to FU.   HTN:  Controlled.  Goal BP <140/90.  Continue current medication regimen.  Encourage low salt diet and exercise.   Make  appointment with [facility] for Prozac.       Jacelyn Pieah Doles-Johnson, NP   [facility]

## 2018-04-18 LAB — CBC
HEMATOCRIT: 41.7 % (ref 37.5–51.0)
Hemoglobin: 14.5 g/dL (ref 13.0–17.7)
MCH: 30.1 pg (ref 26.6–33.0)
MCHC: 34.8 g/dL (ref 31.5–35.7)
MCV: 87 fL (ref 79–97)
PLATELETS: 207 10*3/uL (ref 150–379)
RBC: 4.81 x10E6/uL (ref 4.14–5.80)
RDW: 14.5 % (ref 12.3–15.4)
WBC: 7.9 10*3/uL (ref 3.4–10.8)

## 2018-04-18 LAB — COMPREHENSIVE METABOLIC PANEL
A/G RATIO: 1.6 (ref 1.2–2.2)
ALT: 11 IU/L (ref 0–44)
AST: 14 IU/L (ref 0–40)
Albumin: 4.1 g/dL (ref 3.6–4.8)
Alkaline Phosphatase: 105 IU/L (ref 39–117)
BILIRUBIN TOTAL: 0.3 mg/dL (ref 0.0–1.2)
BUN / CREAT RATIO: 17 (ref 10–24)
BUN: 17 mg/dL (ref 8–27)
CALCIUM: 9.5 mg/dL (ref 8.6–10.2)
CHLORIDE: 102 mmol/L (ref 96–106)
CO2: 26 mmol/L (ref 20–29)
Creatinine, Ser: 1.02 mg/dL (ref 0.76–1.27)
GFR, EST AFRICAN AMERICAN: 90 mL/min/{1.73_m2} (ref >59)
GFR, EST NON AFRICAN AMERICAN: 78 mL/min/{1.73_m2} (ref >59)
GLOBULIN, TOTAL: 2.6 g/dL (ref 1.5–4.5)
Glucose: 94 mg/dL (ref 65–99)
POTASSIUM: 5.2 mmol/L (ref 3.5–5.2)
SODIUM: 142 mmol/L (ref 134–144)
TOTAL PROTEIN: 6.7 g/dL (ref 6.0–8.5)

## 2018-04-19 ENCOUNTER — Other Ambulatory Visit: Payer: Self-pay

## 2018-04-22 ENCOUNTER — Encounter: Payer: Self-pay | Admitting: Nurse Practitioner

## 2018-04-22 ENCOUNTER — Ambulatory Visit (INDEPENDENT_AMBULATORY_CARE_PROVIDER_SITE_OTHER): Payer: Self-pay | Admitting: Nurse Practitioner

## 2018-04-22 VITALS — BP 136/72 | HR 80 | Ht 74.0 in | Wt 198.0 lb

## 2018-04-22 DIAGNOSIS — I493 Ventricular premature depolarization: Secondary | ICD-10-CM

## 2018-04-22 DIAGNOSIS — I2510 Atherosclerotic heart disease of native coronary artery without angina pectoris: Secondary | ICD-10-CM

## 2018-04-22 DIAGNOSIS — I10 Essential (primary) hypertension: Secondary | ICD-10-CM

## 2018-04-22 DIAGNOSIS — I255 Ischemic cardiomyopathy: Secondary | ICD-10-CM

## 2018-04-22 DIAGNOSIS — E785 Hyperlipidemia, unspecified: Secondary | ICD-10-CM

## 2018-04-22 DIAGNOSIS — I38 Endocarditis, valve unspecified: Secondary | ICD-10-CM

## 2018-04-22 DIAGNOSIS — F1910 Other psychoactive substance abuse, uncomplicated: Secondary | ICD-10-CM

## 2018-04-22 DIAGNOSIS — I4892 Unspecified atrial flutter: Secondary | ICD-10-CM

## 2018-04-22 DIAGNOSIS — I5042 Chronic combined systolic (congestive) and diastolic (congestive) heart failure: Secondary | ICD-10-CM

## 2018-04-22 MED ORDER — EZETIMIBE 10 MG PO TABS: 10.0000 mg | ORAL_TABLET | Freq: Every day | ORAL | 5 refills | Status: AC

## 2018-04-22 MED ORDER — CARVEDILOL 12.5 MG PO TABS: 12.5000 mg | ORAL_TABLET | Freq: Two times a day (BID) | ORAL | 3 refills | Status: AC

## 2018-04-22 MED ORDER — ISOSORBIDE MONONITRATE ER 30 MG PO TB24: 30.0000 mg | ORAL_TABLET | Freq: Every day | ORAL | 5 refills | Status: AC

## 2018-04-22 MED ORDER — ATORVASTATIN CALCIUM 10 MG PO TABS: 10.0000 mg | ORAL_TABLET | Freq: Every day | ORAL | 5 refills | Status: AC

## 2018-04-22 MED ORDER — FUROSEMIDE 20 MG PO TABS: 20.0000 mg | ORAL_TABLET | Freq: Two times a day (BID) | ORAL | 5 refills | Status: AC

## 2018-04-22 MED ORDER — LOSARTAN POTASSIUM 100 MG PO TABS: 100.0000 mg | ORAL_TABLET | Freq: Every day | ORAL | 5 refills | Status: AC

## 2018-04-22 MED ORDER — ASPIRIN EC 81 MG PO TBEC: 81.0000 mg | DELAYED_RELEASE_TABLET | Freq: Every day | ORAL | 3 refills | Status: AC

## 2018-04-22 NOTE — Patient Instructions (Addendum)
Medication Instructions:  Your physician has recommended you make the following change in your medication:  DECREASE aspirin to 81mg  once daily INCREASE coreg to 12.5mg  twice daily   Labwork: BMET and magnesium today  Testing/Procedures: Your physician has recommended that you wear a holter monitor. Holter monitors are medical devices that record the heart's electrical activity. Doctors most often use these monitors to diagnose arrhythmias. Arrhythmias are problems with the speed or rhythm of the heartbeat. The monitor is a small, portable device. You can wear one while you do your normal daily activities. This is usually used to diagnose what is causing palpitations/syncope (passing out).    Follow-Up: Your physician recommends that you schedule a follow-up appointment in: 2 months with Ward Givenshris Berge, NP or Dr. Okey DupreEnd.    Any Other Special Instructions Will Be Listed Below (If Applicable). Follow up with the [facility] Failure Clinic in one month.    If you need a refill on your cardiac medications before your next appointment, please call your pharmacy.

## 2018-04-22 NOTE — Progress Notes (Deleted)
   Patient ID: Alex Winters, male    DOB: Jan 15, 1956, 62 y.o.   MRN: [id number]  HPI  Alex Winters is a 62 y/o male with a history of  Echo report from 04/08/18 reviewed and showed an EF of 45-50% along with normal PA pressure. Cardiac catheterization done 04/09/18 which showed an EF of 45% along with moderate-severe CAD with some collaterals.  Admitted 04/08/18 due to HF exacerbation along with COPD. Initially needed IV lasix and then transitioned to oral diuretics. Cardiology consult obtained and left/right heart catheterization was done. Discharged the next day.   He presents today for his initial visit with a chief complaint of  Review of Systems    Physical Exam  Assessment & Plan:  1: Chronic heart failure with preserved ejection fraction- - NYHA class  2: HTN- - saw PCP at [facility] 04/17/18 - BMP on 04/17/18 reviewed and showed sodium 142, potassium 5.2 and GFR 78

## 2018-04-22 NOTE — Progress Notes (Signed)
Office Visit    Patient Name: Alex Winters Date of Encounter: 04/22/2018  Primary Care Provider:  Patient, No Pcp Per Primary Cardiologist:  Yvonne Kendall, MD  Chief Complaint    62 year old male with a history of CAD, hypertension, hyperlipidemia, combined CHF, ischemic cardiomyopathy, paroxysmal atrial flutter, and polysubstance abuse, who presents for hospital follow-up.  Past Medical History    Past Medical History:  Diagnosis Date  . Allergy    Nuts  . CAD (coronary artery disease)    a. 03/2018 Cath: LM nl, LAD 50p, D2 mod dzs, LCX dominant, severe distal dzs, OM1/2 small, diff dzs, RCA nondom, 100p - CTO, L->R collats-->Med Rx.  . Chronic combined systolic (congestive) and diastolic (congestive) heart failure (HCC)    a. 01/2018 Echo: EF 55-60%, Gr2 DD, mild LVH, triv MR, mildly dil RA/LA, nl RV fxn; b. 03/2018 Echo: EF 45-50%, inf HK, Gr2 DD, triv AI, mildly dil LA, nl RV fxn.  . Hypertension   . Ischemic cardiomyopathy    a. 01/2017 Echo: EF 55-60%, Gr2 DD; b. 03/2018 Echo: EF 45-50%, inf HK, Gr2 DD.  Marland Kitchen Paroxysmal atrial flutter (HCC)    a. 01/2018 in setting of cocaine; b. CHA2DS2VASc = 3-->prev placed on elquis - never filled.  . Polysubstance abuse (HCC)    a. History of tobacco and cocaine.  . Renal cyst, left    a. 01/2018 mildly complex L renal cyst incidentally noted on chest CT - rec for nonemergent renal protocol CT or MRI.   Past Surgical History:  Procedure Laterality Date  . KNEE SURGERY    . RIGHT/LEFT HEART CATH AND CORONARY ANGIOGRAPHY N/A 04/09/2018   Procedure: RIGHT/LEFT HEART CATH AND CORONARY ANGIOGRAPHY;  Surgeon: Antonieta Iba, MD;  Location: [facility] INVASIVE CV LAB;  Service: Cardiovascular;  Laterality: N/A;  . TONSILLECTOMY      Allergies  Allergies  Allergen Reactions  . Peanut-Containing Drug Products Other (See Comments)    Abdominal discomfort  . Statins Other (See Comments)    Myalgias, Zocor, lipitor Reaction: joint pain      History of Present Illness    62 y/o ? with the above complex PMH including combined CHF, CAD, HTN, HL, and substance abuse (crack cocaine/tobacco).  He was admitted in 01/2017 w/ 2:1 atrial flutter.  He converted to sinus and was placed on eliquis, which he did not end up taking.  Echo showed nl EF.  He did not f/u with [place] following d/c.  He was readmitted recently, on 4/9, with dyspnea, chest pain, and volume overload in the setting of hypertension.  Echo showed mild LV dysfxn w/ an EF of 45-50% and inf HK and gr2 DD.  Cath was performed and showed a CTO of a small RCA, severe distal LCX dzs, and mod LAD dzs.  Med Rx was recommended.  Since d/c, he has been compliant with meds and f/u, thus far.  He has been weighing himself daily and his wt has been stable @ roughly 196 lbs on his home scale (was 194 lbs @ hospital d/c).  He has not smoked a cigarette since discharge.  He has not used crack in over a year.  He has been walking some and has continued to work, doing odd jobs/handyman work.  He denies chest pain, palpitations, dyspnea, pnd, orthopnea, n, v, dizziness, syncope, edema, weight gain, or early satiety.   Home Medications    Prior to Admission medications   Medication Sig Start Date End Date  Taking? Authorizing Provider  aspirin EC 325 MG tablet Take 1 tablet (325 mg total) by mouth daily. 04/09/18 05/14/19 Yes Auburn BilberryPatel, Shreyang, MD  atorvastatin (LIPITOR) 10 MG tablet Take 1 tablet (10 mg total) by mouth daily at 6 PM. 04/09/18  Yes Auburn BilberryPatel, Shreyang, MD  budesonide-formoterol [facility](SYMBICORT) 160-4.5 MCG/ACT inhaler Inhale 2 puffs into the lungs 2 (two) times daily. 04/09/18  Yes Auburn BilberryPatel, Shreyang, MD  carvedilol (COREG) 6.25 MG tablet Take 1 tablet (6.25 mg total) by mouth 2 (two) times daily with a meal. 04/09/18  Yes Auburn BilberryPatel, Shreyang, MD  ezetimibe (ZETIA) 10 MG tablet Take 1 tablet (10 mg total) by mouth daily. 04/10/18  Yes Auburn BilberryPatel, Shreyang, MD  FLUoxetine (PROZAC) 20 MG capsule Take 1 capsule (20  mg total) by mouth daily. 03/27/18 03/27/19 Yes Johnson, Megan P, DO  fluticasone (FLONASE) 50 MCG/ACT nasal spray Place 2 sprays into both nostrils daily. 04/17/18  Yes Doles-Johnson, Teah, NP  furosemide (LASIX) 20 MG tablet Take 1 tablet (20 mg total) by mouth 2 (two) times daily. 04/09/18 04/09/19 Yes Auburn BilberryPatel, Shreyang, MD  Ipratropium-Albuterol (COMBIVENT RESPIMAT) 20-100 MCG/ACT AERS respimat Inhale 1 puff into the lungs every 6 (six) hours as needed for wheezing. 04/09/18  Yes Auburn BilberryPatel, Shreyang, MD  isosorbide mononitrate (IMDUR) 30 MG 24 hr tablet Take 1 tablet (30 mg total) by mouth daily. 04/10/18  Yes Auburn BilberryPatel, Shreyang, MD  losartan (COZAAR) 100 MG tablet Take 1 tablet (100 mg total) by mouth daily. 04/10/18  Yes Auburn BilberryPatel, Shreyang, MD    Review of Systems    He denies chest pain, palpitations, dyspnea, pnd, orthopnea, n, v, dizziness, syncope, edema, weight gain, or early satiety.  All other systems reviewed and are otherwise negative except as noted above.  Physical Exam    VS:  BP 136/72 (BP Location: Left Arm, Patient Position: Sitting, Cuff Size: Normal)   Pulse 80   Ht 6\' 2"  (1.88 m)   Wt 198 lb (89.8 kg)   BMI 25.42 kg/m  , BMI Body mass index is 25.42 kg/m. GEN: Well nourished, well developed, in no acute distress.  HEENT: normal.  Neck: Supple, no JVD, carotid bruits, or masses. Cardiac: Irreg, freq ectopy, no murmurs, rubs, or gallops. No clubbing, cyanosis, edema.  Radials/DP/PT 2+ and equal bilaterally.  Respiratory:  Respirations regular and unlabored, clear to auscultation bilaterally. GI: Soft, nontender, nondistended, BS + x 4. MS: no deformity or atrophy. Skin: warm and dry, no rash. Neuro:  Strength and sensation are intact. Psych: Normal affect.  Accessory Clinical Findings    ECG - sinus rhythm, freq pvc's 80, baseline artifact, prolonged QT  Assessment & Plan    1.  CAD:  S/p recent admission with cath revealing mod LAD dzs, severe distal LCX dzs, and a CTO of a  non-dominant RCA  Med rx recommended.  Since d/c, he has done well, w/o recurrent chest pain or dyspnea.  He remains on asa (reduce dose to 81mg  daily), statin,  blocker, zetia, nitrate, and losartan.  I will titrate  blocker today in setting of freq pvc's and mild bp elevation.  He does not have insurance and does not plan to participate in cardiac rehab.  2.  ICM/Chronic combined syst/diast CHF: EF 45-50% w/ Gr2 DD.  Wt has been stable @ home and he has been feeling well.  Reports compliance w/ meds.  Euvolemic on exam.  We discussed the importance of daily weights, sodium restriction, medication compliance, and symptom reporting and he verbalizes understanding.  Cont  blocker  (titrate), ARB, lasix.  F/u BMET today.  3.  PVCs:  freq PVCs.  Asymptomatic but could certainly contribute to CHF.  I will place a 24 hr holter to assess PVC burden.  Cont  blocker.  Consider antiarrhythmic if significant PVC burden.  F/u bmet/Mg today.  4.  Essential HTN:  BP mildly elevated today. Titrating  blocker.  5.  HL:  LDL 158 in March.  Did not prev tolerate high dose lipitor or crestor. Currently on lipitor 10 and zetia.  Will plan to f/u fasting lipids in lft's at next appt.  6.  Polysubstance abuse:  No crack cocaine since 12/2016.  Says he quit smoking cigarettes in April.  I congratulated him on this and reiterated the importance of remaining off of cigarettes and drugs.  7.  Paroxysmal Atrial Flutter: Dx in 01/2017 in setting of cocaine abuse.  He did not start eliquis.  CHA2DS2VASc = 3.  Now on ASA in setting of above.  No known recurrence of atrial flutter.  If he remains compliance and could afford OAC, we may wish to consider re-initiation in the future.  8.  Dispo:  F/u in CHF clinic in 1 month.  We'll see in 2 mos.   Nicolasa Ducking, NP 04/22/2018, 1:51 PM

## 2018-04-23 ENCOUNTER — Ambulatory Visit: Payer: Self-pay | Admitting: Family

## 2018-04-23 LAB — BASIC METABOLIC PANEL
BUN/Creatinine Ratio: 15 (ref 10–24)
BUN: 13 mg/dL (ref 8–27)
CALCIUM: 9.3 mg/dL (ref 8.6–10.2)
CHLORIDE: 100 mmol/L (ref 96–106)
CO2: 23 mmol/L (ref 20–29)
Creatinine, Ser: 0.87 mg/dL (ref 0.76–1.27)
GFR calc Af Amer: 106 mL/min/{1.73_m2} (ref >59)
GFR, EST NON AFRICAN AMERICAN: 92 mL/min/{1.73_m2} (ref >59)
GLUCOSE: 91 mg/dL (ref 65–99)
POTASSIUM: 4.6 mmol/L (ref 3.5–5.2)
SODIUM: 138 mmol/L (ref 134–144)

## 2018-04-23 LAB — MAGNESIUM: MAGNESIUM: 2.2 mg/dL (ref 1.6–2.3)

## 2018-04-24 ENCOUNTER — Ambulatory Visit: Payer: Medicaid Other | Admitting: Ophthalmology

## 2018-05-13 ENCOUNTER — Ambulatory Visit: Payer: Self-pay | Admitting: Pharmacy Technician

## 2018-05-14 NOTE — Progress Notes (Signed)
Patient scheduled for eligibility appointment at [facility].  Patient did not show for the appointment on May 13, 2018 at 9:00a.m.  Patient is going to obtain receipts verifying monthly income and contact [facility] to reschedule appointment.  Patient understood that [facility] is unable to provide additional medication assistance until eligibility is determined.  Sherilyn Dacosta Care Manager [facility]

## 2018-05-26 NOTE — Progress Notes (Deleted)
   Patient ID: Alex Winters, male    DOB: May 22, 1956, 62 y.o.   MRN: [id number]  HPI  Alex Winters is a 62 y/o male with a history of  Echo report from 04/08/18 reviewed and showed an EF of 45-50% along with trivial TR and normal PA pressure. Cardiac catheterization done 04/09/18 which showed severe single vessel disease, occluded proximal RCA with collaterals from left to right along with  --- Severe distal LCX disease (would be very difficult to PCI given diffusely disease, distal nature of leasion, also at bifurcation/takeoff of PDA --- moderate proximal LAD, proximal LCX and diagonal disease : would manage medically --- Normal right heart pressures --- At least mildly depressed ejection fraction, estimation of EF limited by nonsustained VT  Admitted 04/08/18 due to acute HF. Cardiology consult obtained and catheterization was done. Initially given IV lasix and then transitioned to oral diuretics. Discharged the following day.  He presents today for his initial visit with a chief complaint of   Review of Systems    Physical Exam    Assessment & Plan:  1: Chronic heart failure with preserved ejection fraction- - NYHA class - saw cardiology Brion Aliment) 04/22/18  2: COPD- - saw PCP (Doles- Johnson) at [facility] 04/17/18 - BMP 04/22/18 reviewed and showed sodium 138, potassium 4.6 and GFR 92

## 2018-05-27 ENCOUNTER — Telehealth: Payer: Self-pay | Admitting: Family

## 2018-05-27 ENCOUNTER — Ambulatory Visit: Payer: Self-pay | Admitting: Family

## 2018-05-27 NOTE — Telephone Encounter (Signed)
Patient did not show for his [facility] appointment on 05/27/18. Will attempt to reschedule.

## 2018-06-23 ENCOUNTER — Ambulatory Visit: Payer: Self-pay | Admitting: Nurse Practitioner

## 2018-06-23 DIAGNOSIS — R0989 Other specified symptoms and signs involving the circulatory and respiratory systems: Secondary | ICD-10-CM

## 2018-06-24 ENCOUNTER — Encounter: Payer: Self-pay | Admitting: Nurse Practitioner

## 2018-06-26 ENCOUNTER — Encounter: Payer: Self-pay | Admitting: Nurse Practitioner

## 2018-07-17 ENCOUNTER — Ambulatory Visit: Payer: Self-pay
